# Patient Record
Sex: Female | Born: 1960 | Race: Black or African American | Hispanic: No | Marital: Married | Smoking: Never smoker
Health system: Southern US, Community
[De-identification: ages and names within clinical notes are randomized; demographics above are authoritative.]

## PROBLEM LIST (undated history)

## (undated) DIAGNOSIS — K219 Gastro-esophageal reflux disease without esophagitis: Secondary | ICD-10-CM

## (undated) DIAGNOSIS — E785 Hyperlipidemia, unspecified: Secondary | ICD-10-CM

## (undated) DIAGNOSIS — I10 Essential (primary) hypertension: Secondary | ICD-10-CM

## (undated) DIAGNOSIS — K449 Diaphragmatic hernia without obstruction or gangrene: Secondary | ICD-10-CM

## (undated) DIAGNOSIS — I2510 Atherosclerotic heart disease of native coronary artery without angina pectoris: Secondary | ICD-10-CM

## (undated) HISTORY — DX: Atherosclerotic heart disease of native coronary artery without angina pectoris: I25.10

## (undated) HISTORY — DX: Hyperlipidemia, unspecified: E78.5

## (undated) HISTORY — DX: Diaphragmatic hernia without obstruction or gangrene: K44.9

## (undated) HISTORY — PX: OTHER SURGICAL HISTORY: SHX169

## (undated) HISTORY — DX: Gastro-esophageal reflux disease without esophagitis: K21.9

## (undated) HISTORY — DX: Essential (primary) hypertension: I10

---

## 2011-04-12 DIAGNOSIS — K449 Diaphragmatic hernia without obstruction or gangrene: Secondary | ICD-10-CM | POA: Insufficient documentation

## 2012-10-21 DIAGNOSIS — R32 Unspecified urinary incontinence: Secondary | ICD-10-CM | POA: Insufficient documentation

## 2012-10-21 DIAGNOSIS — N819 Female genital prolapse, unspecified: Secondary | ICD-10-CM | POA: Insufficient documentation

## 2015-03-03 ENCOUNTER — Ambulatory Visit (INDEPENDENT_AMBULATORY_CARE_PROVIDER_SITE_OTHER): Payer: BLUE CROSS/BLUE SHIELD

## 2015-03-03 ENCOUNTER — Ambulatory Visit (INDEPENDENT_AMBULATORY_CARE_PROVIDER_SITE_OTHER): Payer: BLUE CROSS/BLUE SHIELD | Admitting: Physician Assistant

## 2015-03-03 ENCOUNTER — Encounter: Payer: Self-pay | Admitting: Physician Assistant

## 2015-03-03 VITALS — BP 130/76 | HR 75 | Ht 63.0 in | Wt 206.0 lb

## 2015-03-03 DIAGNOSIS — J989 Respiratory disorder, unspecified: Secondary | ICD-10-CM

## 2015-03-03 DIAGNOSIS — E785 Hyperlipidemia, unspecified: Secondary | ICD-10-CM | POA: Insufficient documentation

## 2015-03-03 DIAGNOSIS — R0683 Snoring: Secondary | ICD-10-CM

## 2015-03-03 DIAGNOSIS — E669 Obesity, unspecified: Secondary | ICD-10-CM

## 2015-03-03 DIAGNOSIS — M50322 Other cervical disc degeneration at C5-C6 level: Secondary | ICD-10-CM | POA: Diagnosis not present

## 2015-03-03 DIAGNOSIS — R0989 Other specified symptoms and signs involving the circulatory and respiratory systems: Secondary | ICD-10-CM

## 2015-03-03 DIAGNOSIS — E559 Vitamin D deficiency, unspecified: Secondary | ICD-10-CM

## 2015-03-03 DIAGNOSIS — M5412 Radiculopathy, cervical region: Secondary | ICD-10-CM

## 2015-03-03 DIAGNOSIS — R000 Tachycardia, unspecified: Secondary | ICD-10-CM | POA: Diagnosis not present

## 2015-03-03 DIAGNOSIS — M5030 Other cervical disc degeneration, unspecified cervical region: Secondary | ICD-10-CM | POA: Insufficient documentation

## 2015-03-03 DIAGNOSIS — R5383 Other fatigue: Secondary | ICD-10-CM

## 2015-03-03 DIAGNOSIS — R062 Wheezing: Secondary | ICD-10-CM

## 2015-03-03 DIAGNOSIS — Z131 Encounter for screening for diabetes mellitus: Secondary | ICD-10-CM

## 2015-03-03 DIAGNOSIS — N3281 Overactive bladder: Secondary | ICD-10-CM | POA: Insufficient documentation

## 2015-03-03 LAB — COMPLETE METABOLIC PANEL WITH GFR
ALT: 12 U/L (ref 6–29)
AST: 20 U/L (ref 10–35)
Albumin: 3.9 g/dL (ref 3.6–5.1)
Alkaline Phosphatase: 65 U/L (ref 33–130)
BUN: 15 mg/dL (ref 7–25)
CALCIUM: 9.1 mg/dL (ref 8.6–10.4)
CHLORIDE: 103 mmol/L (ref 98–110)
CO2: 30 mmol/L (ref 20–31)
CREATININE: 0.81 mg/dL (ref 0.50–1.05)
GFR, Est African American: >89 mL/min (ref >=60)
GFR, Est Non African American: 83 mL/min (ref >=60)
Glucose, Bld: 88 mg/dL (ref 65–99)
POTASSIUM: 4 mmol/L (ref 3.5–5.3)
SODIUM: 138 mmol/L (ref 135–146)
Total Bilirubin: 0.4 mg/dL (ref 0.2–1.2)
Total Protein: 6.8 g/dL (ref 6.1–8.1)

## 2015-03-03 LAB — LIPID PANEL
CHOL/HDL RATIO: 5.5 ratio — AB (ref <=5.0)
CHOLESTEROL: 246 mg/dL — AB (ref 125–200)
HDL: 45 mg/dL — ABNORMAL LOW (ref >=46)
LDL CALC: 179 mg/dL — AB (ref <130)
Triglycerides: 112 mg/dL (ref <150)
VLDL: 22 mg/dL (ref <30)

## 2015-03-03 MED ORDER — ALBUTEROL SULFATE HFA 108 (90 BASE) MCG/ACT IN AERS: 2.0000 | INHALATION_SPRAY | Freq: Four times a day (QID) | RESPIRATORY_TRACT | Status: DC | PRN

## 2015-03-03 MED ORDER — MELOXICAM 15 MG PO TABS: 15.0000 mg | ORAL_TABLET | Freq: Every day | ORAL | Status: DC

## 2015-03-03 MED ORDER — IPRATROPIUM-ALBUTEROL 0.5-2.5 (3) MG/3ML IN SOLN
3.0000 mL | Freq: Once | RESPIRATORY_TRACT | Status: AC
  Administered 2015-03-03: 3 mL via RESPIRATORY_TRACT

## 2015-03-03 NOTE — Patient Instructions (Addendum)
Will get formal PT.   Cervical Radiculopathy Cervical radiculopathy happens when a nerve in the neck (cervical nerve) is pinched or bruised. This condition can develop because of an injury or as part of the normal aging process. Pressure on the cervical nerves can cause pain or numbness that runs from the neck all the way down into the arm and fingers. Usually, this condition gets better with rest. Treatment may be needed if the condition does not improve.  CAUSES This condition may be caused by:  Injury.  Slipped (herniated) disk.  Muscle tightness in the neck because of overuse.  Arthritis.  Breakdown or degeneration in the bones and joints of the spine (spondylosis) due to aging.  Bone spurs that may develop near the cervical nerves. SYMPTOMS Symptoms of this condition include:  Pain that runs from the neck to the arm and hand. The pain can be severe or irritating. It may be worse when the neck is moved.  Numbness or weakness in the affected arm and hand. DIAGNOSIS This condition may be diagnosed based on symptoms, medical history, and a physical exam. You may also have tests, including:  X-rays.  CT scan.  MRI.  Electromyogram (EMG).  Nerve conduction tests. TREATMENT In many cases, treatment is not needed for this condition. With rest, the condition usually gets better over time. If treatment is needed, options may include:  Wearing a soft neck collar for short periods of time.  Physical therapy to strengthen your neck muscles.  Medicines, such as NSAIDs, oral corticosteroids, or spinal injections.  Surgery. This may be needed if other treatments do not help. Various types of surgery may be done depending on the cause of your problems. HOME CARE INSTRUCTIONS Managing Pain  Take over-the-counter and prescription medicines only as told by your health care provider.  If directed, apply ice to the affected area.  Put ice in a plastic bag.  Place a towel between  your skin and the bag.  Leave the ice on for 20 minutes, 2-3 times per day.  If ice does not help, you can try using heat. Take a warm shower or warm bath, or use a heat pack as told by your health care provider.  Try a gentle neck and shoulder massage to help relieve symptoms. Activity  Rest as needed. Follow instructions from your health care provider about any restrictions on activities.  Do stretching and strengthening exercises as told by your health care provider or physical therapist. General Instructions  If you were given a soft collar, wear it as told by your health care provider.  Use a flat pillow when you sleep.  Keep all follow-up visits as told by your health care provider. This is important. SEEK MEDICAL CARE IF:  Your condition does not improve with treatment. SEEK IMMEDIATE MEDICAL CARE IF:  Your pain gets much worse and cannot be controlled with medicines.  You have weakness or numbness in your hand, arm, face, or leg.  You have a high fever.  You have a stiff, rigid neck.  You lose control of your bowels or your bladder (have incontinence).  You have trouble with walking, balance, or speaking.   This information is not intended to replace advice given to you by your health care provider. Make sure you discuss any questions you have with your health care provider.   Document Released: 11/20/2000 Document Revised: 11/16/2014 Document Reviewed: 04/21/2014 Elsevier Interactive Patient Education [employer].

## 2015-03-03 NOTE — Progress Notes (Signed)
Subjective:    Patient ID: Olivia Miller, female    DOB: [birth year], 54 y.o.   MRN: [id number]  HPI  Pt is a 54 yo female who presents to the clinic to establish care.   .. Active Ambulatory Problems    Diagnosis Date Noted  . Hyperlipidemia 03/03/2015  . Tachycardia 03/03/2015  . Left cervical radiculopathy 03/03/2015  . OAB (overactive bladder) 03/03/2015  . DDD (degenerative disc disease), cervical 03/03/2015   Resolved Ambulatory Problems    Diagnosis Date Noted  . No Resolved Ambulatory Problems   No Additional Past Medical History   .[name] Family History  Problem Relation Age of Onset  . Cancer Father   . Stroke Father    .[name] Social History   Social History  . Marital Status: Married    Spouse Name: N/A  . Number of Children: N/A  . Years of Education: N/A   Occupational History  . Not on file.   Social History Main Topics  . Smoking status: Never Smoker   . Smokeless tobacco: Not on file  . Alcohol Use: No  . Drug Use: No  . Sexual Activity: Yes   Other Topics Concern  . Not on file   Social History Narrative  . No narrative on file   patient has multiple concerns today.  When her main concerns is tachycardia. She recently had an episode of increased heart rate that she went to the emergency room for full cardiac workup was done and was negative. They suggested if these episodes continue to get a stress test. She is not having any chest pain but continues to have some episodes of increased heart rate. She would like a stress test.  She is also having increased problem with her neck and into her left arm and fingers. It is not all the time but episodic. She's had this in the past. It did seem to be getting better but has returned. Numbness and tingling goes into all fingers but mainly her index middle and ring. Her left arm will feel very achy. Sometimes it aches in the upper arm even the touch. She is seen a rheumatologist and told she has some cervical  degenerative changes and also diagnosed her with fibromyalgia. 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 Her left normal sometimes even give out. She has not tried anything to make better.  Patient is also very tired. She has a history of vitamin D deficiency. She is taking vitamin D daily. She is not sleeping well. She admits to snoring. She wakes up numerous times during the night.  She has had some recent shortness of breath and wheezing. She denies any sinus pressure, fever, ear pain, sore throat. Cough is nonproductive. She has not taken anything to make better. She has never been diagnosed with asthma.  Hyperlipidemia. She has not had this rechecked in a while.     Xray normal. Online.fibro. Rheumatologist ruled out everything. Lost insurance. Throbbing pain. Left arm give out couldn't lift children. Starting exercise. Not using pain went aw   Review of Systems  All other systems reviewed and are negative.      Objective:   Physical Exam  Constitutional: She is oriented to person, place, and time. She appears well-developed and well-nourished.  Obese.   HENT:  Head: Normocephalic and atraumatic.  Right Ear: External ear normal.  Left Ear: External ear normal.  Nose: Nose normal.  Mouth/Throat: Oropharynx is clear and  moist.  TM's clear.  Negative for maxillary or frontal sinus pressure.   Eyes: Conjunctivae are normal. Right eye exhibits no discharge. Left eye exhibits no discharge.  Neck: Normal range of motion. Neck supple.  Cardiovascular: Normal rate, regular rhythm and normal heart sounds.   Pulmonary/Chest: Effort normal.  Bilateral expiratory wheezing heard on exam.   Musculoskeletal:  Normal ROM of both shoulders.  Strength 5/5 of upper extremities.  Hand grip 5/5.  No pain to palpation over left elbow.  Negative finklestein, phalens, tinels.  Tight neck muscles.  No pain to palpation over c-spine.   Lymphadenopathy:    She has  no cervical adenopathy.  Neurological: She is alert and oriented to person, place, and time.  Psychiatric: She has a normal mood and affect. Her behavior is normal.          Assessment & Plan:  Tachycardia- reassured pt that looked great today. On pt's request will order stress test.   Reactive airway disease- peak flows yellow. duoneb given in office today with improvement. Inhaler to be given as needed. Declined steriods. Follow up as needed or if worsening.   Left cervical radiculopathy- will get xray today of cspine. Discussed prednisone taper. Pt declined. mobic daily as needed. Discussed GI irritation. No hx of PUD. Take with food. Exercises given. Will get formal PT to start. Certainly if pt has fibromyalgia could be making this pain worse but not classic for only fibromyalgia pain I do think a musculoskeletal problem is present.   OAB-discussed kegals there are medication options as well pt will follow up with this.   Fatigue/obesity/snoring- STOP BANG 4 positive and high risk. Will order sleep study. Hx of vitamin D def. Will get vitamin D.

## 2015-03-04 LAB — VITAMIN D 25 HYDROXY (VIT D DEFICIENCY, FRACTURES): VIT D 25 HYDROXY: 33 ng/mL (ref 30–100)

## 2015-03-15 ENCOUNTER — Other Ambulatory Visit: Payer: Self-pay | Admitting: Physician Assistant

## 2015-03-15 MED ORDER — CYCLOBENZAPRINE HCL 10 MG PO TABS: 10.0000 mg | ORAL_TABLET | Freq: Every day | ORAL | Status: DC

## 2015-03-17 ENCOUNTER — Ambulatory Visit: Payer: BLUE CROSS/BLUE SHIELD | Admitting: Rehabilitative and Restorative Service Providers"

## 2015-05-04 ENCOUNTER — Ambulatory Visit (HOSPITAL_BASED_OUTPATIENT_CLINIC_OR_DEPARTMENT_OTHER): Payer: BLUE CROSS/BLUE SHIELD

## 2015-05-05 ENCOUNTER — Other Ambulatory Visit: Payer: Self-pay | Admitting: Physician Assistant

## 2015-06-02 ENCOUNTER — Encounter: Payer: Self-pay | Admitting: Physician Assistant

## 2015-06-02 ENCOUNTER — Ambulatory Visit (INDEPENDENT_AMBULATORY_CARE_PROVIDER_SITE_OTHER): Payer: BLUE CROSS/BLUE SHIELD | Admitting: Physician Assistant

## 2015-06-02 ENCOUNTER — Ambulatory Visit (INDEPENDENT_AMBULATORY_CARE_PROVIDER_SITE_OTHER): Payer: Self-pay

## 2015-06-02 DIAGNOSIS — M542 Cervicalgia: Secondary | ICD-10-CM

## 2015-06-02 DIAGNOSIS — M546 Pain in thoracic spine: Secondary | ICD-10-CM

## 2015-06-02 DIAGNOSIS — M25521 Pain in right elbow: Secondary | ICD-10-CM

## 2015-06-02 DIAGNOSIS — M25562 Pain in left knee: Secondary | ICD-10-CM

## 2015-06-02 DIAGNOSIS — M545 Low back pain: Secondary | ICD-10-CM

## 2015-06-02 DIAGNOSIS — M25561 Pain in right knee: Secondary | ICD-10-CM

## 2015-06-02 DIAGNOSIS — M549 Dorsalgia, unspecified: Secondary | ICD-10-CM
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
## 2015-06-02 NOTE — Patient Instructions (Signed)
Cervical Sprain  A cervical sprain is an injury in the neck in which the strong, fibrous tissues (ligaments) that connect your neck bones stretch or tear. Cervical sprains can range from mild to severe. Severe cervical sprains can cause the neck vertebrae to be unstable. This can lead to damage of the spinal cord and can result in serious nervous system problems. The amount of time it takes for a cervical sprain to get better depends on the cause and extent of the injury. Most cervical sprains heal in 1 to 3 weeks.  CAUSES   Severe cervical sprains may be caused by:    Contact sport injuries (such as from football, rugby, wrestling, hockey, auto racing, gymnastics, diving, martial arts, or boxing).    Motor vehicle collisions.    Whiplash injuries. This is an injury from a sudden forward and backward whipping movement of the head and neck.   Falls.   Mild cervical sprains may be caused by:    Being in an awkward position, such as while cradling a telephone between your ear and shoulder.    Sitting in a chair that does not offer proper support.    Working at a poorly designed computer station.    Looking up or down for long periods of time.   SYMPTOMS    Pain, soreness, stiffness, or a burning sensation in the front, back, or sides of the neck. This discomfort may develop immediately after the injury or slowly, 24 hours or more after the injury.    Pain or tenderness directly in the middle of the back of the neck.    Shoulder or upper back pain.    Limited ability to move the neck.    Headache.    Dizziness.    Weakness, numbness, or tingling in the hands or arms.    Muscle spasms.    Difficulty swallowing or chewing.    Tenderness and swelling of the neck.   DIAGNOSIS   Most of the time your health care provider can diagnose a cervical sprain by taking your history and doing a physical exam. Your health care provider will ask about previous neck injuries and any known neck  problems, such as arthritis in the neck. X-rays may be taken to find out if there are any other problems, such as with the bones of the neck. Other tests, such as a CT scan or MRI, may also be needed.   TREATMENT   Treatment depends on the severity of the cervical sprain. Mild sprains can be treated with rest, keeping the neck in place (immobilization), and pain medicines. Severe cervical sprains are immediately immobilized. Further treatment is done to help with pain, muscle spasms, and other symptoms and may include:   Medicines, such as pain relievers, numbing medicines, or muscle relaxants.    Physical therapy. This may involve stretching exercises, strengthening exercises, and posture training. Exercises and improved posture can help stabilize the neck, strengthen muscles, and help stop symptoms from returning.   HOME CARE INSTRUCTIONS    Put ice on the injured area.     Put ice in a plastic bag.     Place a towel between your skin and the bag.     Leave the ice on for 15-20 minutes, 3-4 times a day.    If your injury was severe, you may have been given a cervical collar to wear. A cervical collar is a two-piece collar designed to keep your neck from moving while it heals.      Do not remove the collar unless instructed by your health care provider.    If you have long hair, keep it outside of the collar.    Ask your health care provider before making any adjustments to your collar. Minor adjustments may be required over time to improve comfort and reduce pressure on your chin or on the back of your head.    Ifyou are allowed to remove the collar for cleaning or bathing, follow your health care provider's instructions on how to do so safely.    Keep your collar clean by wiping it with mild soap and water and drying it completely. If the collar you have been given includes removable pads, remove them every 1-2 days and hand wash them with soap and water. Allow them to air dry. They should be completely  dry before you wear them in the collar.    If you are allowed to remove the collar for cleaning and bathing, wash and dry the skin of your neck. Check your skin for irritation or sores. If you see any, tell your health care provider.    Do not drive while wearing the collar.    Only take over-the-counter or prescription medicines for pain, discomfort, or fever as directed by your health care provider.    Keep all follow-up appointments as directed by your health care provider.    Keep all physical therapy appointments as directed by your health care provider.    Make any needed adjustments to your workstation to promote good posture.    Avoid positions and activities that make your symptoms worse.    Warm up and stretch before being active to help prevent problems.   SEEK MEDICAL CARE IF:    Your pain is not controlled with medicine.    You are unable to decrease your pain medicine over time as planned.    Your activity level is not improving as expected.   SEEK IMMEDIATE MEDICAL CARE IF:    You develop any bleeding.   You develop stomach upset.   You have signs of an allergic reaction to your medicine.    Your symptoms get worse.    You develop new, unexplained symptoms.    You have numbness, tingling, weakness, or paralysis in any part of your body.   MAKE SURE YOU:    Understand these instructions.   Will watch your condition.   Will get help right away if you are not doing well or get worse.     This information is not intended to replace advice given to you by your health care provider. Make sure you discuss any questions you have with your health care provider.     Document Released: 12/23/2006 Document Revised: 03/02/2013 Document Reviewed: 09/02/2012  Elsevier Interactive Patient Education 2016 Else[employer]

## 2015-06-07 ENCOUNTER — Encounter: Payer: Self-pay | Admitting: Physician Assistant

## 2015-06-07 ENCOUNTER — Other Ambulatory Visit: Payer: Self-pay | Admitting: Physician Assistant

## 2015-06-07 DIAGNOSIS — I6521 Occlusion and stenosis of right carotid artery: Secondary | ICD-10-CM | POA: Insufficient documentation

## 2015-06-16 ENCOUNTER — Ambulatory Visit (HOSPITAL_BASED_OUTPATIENT_CLINIC_OR_DEPARTMENT_OTHER)
Admission: RE | Admit: 2015-06-16 | Discharge: 2015-06-16 | Disposition: A | Discharge status: Home / Self Care | Payer: Self-pay | Source: Ambulatory Visit | Attending: Physician Assistant | Admitting: Physician Assistant

## 2015-06-16 DIAGNOSIS — I6521 Occlusion and stenosis of right carotid artery: Secondary | ICD-10-CM | POA: Insufficient documentation

## 2015-06-20 ENCOUNTER — Other Ambulatory Visit: Payer: Self-pay | Admitting: Physician Assistant

## 2015-06-20 MED ORDER — ATORVASTATIN CALCIUM 40 MG PO TABS: 40.0000 mg | ORAL_TABLET | Freq: Every day | ORAL | Status: DC

## 2016-04-02 ENCOUNTER — Encounter: Payer: Self-pay | Admitting: Physician Assistant

## 2016-04-02 ENCOUNTER — Ambulatory Visit (INDEPENDENT_AMBULATORY_CARE_PROVIDER_SITE_OTHER): Payer: Managed Care, Other (non HMO) | Admitting: Physician Assistant

## 2016-04-02 VITALS — BP 165/100 | HR 82 | Ht 63.0 in | Wt 203.0 lb

## 2016-04-02 DIAGNOSIS — R0681 Apnea, not elsewhere classified: Secondary | ICD-10-CM | POA: Insufficient documentation

## 2016-04-02 DIAGNOSIS — J0140 Acute pansinusitis, unspecified: Secondary | ICD-10-CM

## 2016-04-02 DIAGNOSIS — G478 Other sleep disorders: Secondary | ICD-10-CM | POA: Insufficient documentation

## 2016-04-02 DIAGNOSIS — K0889 Other specified disorders of teeth and supporting structures: Secondary | ICD-10-CM | POA: Diagnosis not present

## 2016-04-02 DIAGNOSIS — R0683 Snoring: Secondary | ICD-10-CM | POA: Diagnosis not present

## 2016-04-02 DIAGNOSIS — Z1231 Encounter for screening mammogram for malignant neoplasm of breast: Secondary | ICD-10-CM

## 2016-04-02 DIAGNOSIS — Z800 Family history of malignant neoplasm of digestive organs: Secondary | ICD-10-CM

## 2016-04-02 DIAGNOSIS — Z1211 Encounter for screening for malignant neoplasm of colon: Secondary | ICD-10-CM

## 2016-04-02 NOTE — Progress Notes (Signed)
   Subjective:    Patient ID: Olivia Miller, female    DOB: Oct 04, 1960, 56 y.o.   MRN: [id number]  HPI  Pt is a 56 yo female who presents to the clinic after urgent care visit for sinusitis on 03/30/16. Pt had 3 wisdom teeth removed on 03/22/16 and 3 days later devleoped dry socket. She saw dentist and thought pain was from dry socket but she was then diagnosed with sinusitis. She is on augmentin, norco, toradol, and prednisone. She is feeling better today.   She request sleep apnea study but does not want ordered today.    Review of Systems  All other systems reviewed and are negative.      Objective:   Physical Exam  Constitutional: She is oriented to person, place, and time. She appears well-developed and well-nourished.  HENT:  Head: Normocephalic and atraumatic.  Right Ear: External ear normal.  Left Ear: External ear normal.  Nose: Nose normal.  Mouth/Throat: Oropharynx is clear and moist. No oropharyngeal exudate.  Right TM dull.  Left TM normal.  Tenderness over left maxillary sinuses and bilateral frontal sinuses.  Well healed wisdom tooth extraction sites.   Eyes: Conjunctivae are normal. Right eye exhibits no discharge. Left eye exhibits no discharge.  Neck: Normal range of motion. Neck supple.  Cardiovascular: Normal rate, regular rhythm and normal heart sounds.   Pulmonary/Chest: Effort normal and breath sounds normal. She has no wheezes.  Lymphadenopathy:    She has cervical adenopathy.  Neurological: She is alert and oriented to person, place, and time.  Psychiatric: She has a normal mood and affect. Her behavior is normal.          Assessment & Plan:  Marland KitchenMarland KitchenDiagnoses and all orders for this visit:  Acute non-recurrent pansinusitis  Visit for screening mammogram -     MM DIGITAL SCREENING BILATERAL; Future  Pain, dental  Snoring  Apneic episode  Non-restorative sleep  Colon cancer screening -     Ambulatory referral to Gastroenterology  Family  history of colon cancer -     Ambulatory referral to Gastroenterology   Continue on current sinusitis plan. Follow up as needed. Discussed to start tapering off norco. Only to use sparing. Abuse potential is high with these medications.    High risk stop bang. Will refer for sleep study when she request.   Needs CPE to discuss preventive care.

## 2016-04-10 ENCOUNTER — Encounter: Payer: Self-pay | Admitting: Physician Assistant

## 2016-04-10 ENCOUNTER — Ambulatory Visit (INDEPENDENT_AMBULATORY_CARE_PROVIDER_SITE_OTHER): Payer: Managed Care, Other (non HMO)

## 2016-04-10 ENCOUNTER — Ambulatory Visit (INDEPENDENT_AMBULATORY_CARE_PROVIDER_SITE_OTHER): Payer: Managed Care, Other (non HMO) | Admitting: Physician Assistant

## 2016-04-10 ENCOUNTER — Ambulatory Visit: Payer: Managed Care, Other (non HMO)

## 2016-04-10 VITALS — BP 130/89 | HR 86 | Ht 63.0 in | Wt 193.0 lb

## 2016-04-10 DIAGNOSIS — R197 Diarrhea, unspecified: Secondary | ICD-10-CM

## 2016-04-10 DIAGNOSIS — Z79899 Other long term (current) drug therapy: Secondary | ICD-10-CM

## 2016-04-10 DIAGNOSIS — R51 Headache: Secondary | ICD-10-CM | POA: Diagnosis not present

## 2016-04-10 DIAGNOSIS — J3489 Other specified disorders of nose and nasal sinuses: Secondary | ICD-10-CM

## 2016-04-10 DIAGNOSIS — Z1231 Encounter for screening mammogram for malignant neoplasm of breast: Secondary | ICD-10-CM

## 2016-04-10 DIAGNOSIS — R928 Other abnormal and inconclusive findings on diagnostic imaging of breast: Secondary | ICD-10-CM | POA: Diagnosis not present

## 2016-04-10 DIAGNOSIS — R519 Headache, unspecified: Secondary | ICD-10-CM

## 2016-04-10 DIAGNOSIS — Z13220 Encounter for screening for lipoid disorders: Secondary | ICD-10-CM

## 2016-04-10 DIAGNOSIS — E559 Vitamin D deficiency, unspecified: Secondary | ICD-10-CM

## 2016-04-10 NOTE — Progress Notes (Signed)
   Subjective:    Patient ID: Olivia Miller, female    DOB: 01-17-1961, 56 y.o.   MRN: [id number]  HPI  Pt comes to clinic to follow up after ED visit on 04/06/16 for worsening sinus pressure and dental pain. Pt was concerned because she started tapering off pain medication like instructed at our visit and her pain came back worse. She then felt very agitated and shaky and literally "ached all over".  In ED checked CBC, UA, lipase, EKG, CXR, Flu, CMP all was normal. She has continued to have diarrhea watery to yellow at least 6 times a day. immodium did help decreasing to 3-4 times a day but then came back. No fever or abdominal pain. Has not been on her probiotic. She has stopped all abx. She only took 7 days of abx. She continues to have left sided facial pressure and pain. She feels like her "head is full".  She would like to know if her sinus infection has cleared.    Review of Systems See HPI>     Objective:   Physical Exam  Constitutional: She is oriented to person, place, and time. She appears well-developed and well-nourished.  HENT:  Head: Normocephalic and atraumatic.  Right Ear: External ear normal.  Left Ear: External ear normal.  Nose: Nose normal.  Mouth/Throat: Oropharynx is clear and moist. No oropharyngeal exudate.  Tenderness over left maxillary sinusitis.   Eyes: Conjunctivae are normal.  Neck: Normal range of motion. Neck supple.  Cardiovascular: Normal rate, regular rhythm and normal heart sounds.   Pulmonary/Chest: Effort normal and breath sounds normal. She has no wheezes.  Lymphadenopathy:    She has no cervical adenopathy.  Neurological: She is alert and oriented to person, place, and time.  Psychiatric: She has a normal mood and affect. Her behavior is normal.          Assessment & Plan:  Marland KitchenMarland KitchenDiagnoses and all orders for this visit:  Diarrhea, unspecified type -     Clostridium difficile culture-fecal  Sinus pressure -     CT MAXILLOFACIAL WO  CONTRAST; Future -     CT MAXILLOFACIAL WO CONTRAST  Left facial pressure and pain -     CT MAXILLOFACIAL WO CONTRAST; Future -     CT MAXILLOFACIAL WO CONTRAST  Screening for lipid disorders -     Lipid panel  Medication management -     VITAMIN D 25 Hydroxy (Vit-D Deficiency, Fractures)  Vitamin D deficiency -     VITAMIN D 25 Hydroxy (Vit-D Deficiency, Fractures)  Visit for screening mammogram -     MM DIGITAL SCREENING BILATERAL   Will get CT to evaluate sinuses to see if infection is present.  Will make referral to ENT.   Due to diarrhea discussed could be normal variant due to abx; however, will rule out C.diff. Culture ordered. Instructed patient to restart probiotic.   I do think her worsening pain, shakiness, body aches could have been prednisone and even coming off pain medication. That has resolved for the most part.   Labs ordered for upcoming CPE.

## 2016-04-10 NOTE — Patient Instructions (Signed)
Will make referral to ENT.  

## 2016-04-11 ENCOUNTER — Ambulatory Visit (INDEPENDENT_AMBULATORY_CARE_PROVIDER_SITE_OTHER): Payer: Managed Care, Other (non HMO)

## 2016-04-11 DIAGNOSIS — R6884 Jaw pain: Secondary | ICD-10-CM | POA: Diagnosis not present

## 2016-04-11 DIAGNOSIS — G8918 Other acute postprocedural pain: Secondary | ICD-10-CM

## 2016-04-11 DIAGNOSIS — Z98818 Other dental procedure status: Secondary | ICD-10-CM | POA: Diagnosis not present

## 2016-04-11 LAB — VITAMIN D 25 HYDROXY (VIT D DEFICIENCY, FRACTURES): Vit D, 25-Hydroxy: 49 ng/mL (ref 30–100)

## 2016-04-11 LAB — LIPID PANEL
CHOL/HDL RATIO: 5.7 ratio — AB (ref <5.0)
Cholesterol: 233 mg/dL — ABNORMAL HIGH (ref <200)
HDL: 41 mg/dL — AB (ref >50)
LDL Cholesterol: 166 mg/dL — ABNORMAL HIGH (ref <100)
Triglycerides: 129 mg/dL (ref <150)
VLDL: 26 mg/dL (ref <30)

## 2016-04-12 ENCOUNTER — Telehealth: Payer: Self-pay | Admitting: *Deleted

## 2016-04-12 MED ORDER — METRONIDAZOLE 500 MG PO TABS: 500.0000 mg | ORAL_TABLET | Freq: Three times a day (TID) | ORAL | 0 refills | Status: DC

## 2016-04-12 NOTE — Telephone Encounter (Signed)
Patient is calling because she is still having diarrhea. She states the immodium is not helping at all. She is having diarrhea to the point where "it's falling out " of her. Please advise

## 2016-04-12 NOTE — Telephone Encounter (Signed)
Ok lets go ahead and treat for c.diff infection with pending results. Please send in metronidazole 500mg  three times a day for 10 days. #30 tablets. Will call when results come in. Do not drink alcohol while on metronidazole. If you can not stay properly hydrated then need to go to urgent care for fluids.

## 2016-04-12 NOTE — Progress Notes (Signed)
Call pt: vitamin D looks great.   Cholesterol is still elevated. We need to consider statin to lower cholesterol.

## 2016-04-12 NOTE — Progress Notes (Signed)
Call pt: CT showed clear sinuses. Sinusitis has cleared. Keep ENT appt.

## 2016-04-15 ENCOUNTER — Other Ambulatory Visit: Payer: Self-pay | Admitting: Physician Assistant

## 2016-04-15 MED ORDER — ATORVASTATIN CALCIUM 40 MG PO TABS: 40.0000 mg | ORAL_TABLET | Freq: Every day | ORAL | 1 refills | Status: DC

## 2016-04-15 NOTE — Progress Notes (Signed)
Sent statin do not start until feeling better. How is diarrhea today? C.diff is still pending.

## 2016-04-17 LAB — CLOSTRIDIUM DIFFICILE CULTURE-FECAL

## 2016-04-18 ENCOUNTER — Encounter: Payer: Self-pay | Admitting: Physician Assistant

## 2016-04-18 DIAGNOSIS — A0472 Enterocolitis due to Clostridium difficile, not specified as recurrent: Secondary | ICD-10-CM | POA: Insufficient documentation

## 2016-04-18 LAB — CLOSTRIDIUM DIFFICILE TOXIN: C DIFF TOXIN A: DETECTED — AB

## 2016-04-23 ENCOUNTER — Encounter: Payer: Self-pay | Admitting: Physician Assistant

## 2016-04-23 DIAGNOSIS — N6310 Unspecified lump in the right breast, unspecified quadrant: Secondary | ICD-10-CM | POA: Insufficient documentation

## 2016-04-23 NOTE — Progress Notes (Signed)
Call pt: mass seen of right breast. Follow up with more images.

## 2016-04-25 ENCOUNTER — Other Ambulatory Visit: Payer: Self-pay | Admitting: Physician Assistant

## 2016-04-25 DIAGNOSIS — R928 Other abnormal and inconclusive findings on diagnostic imaging of breast: Secondary | ICD-10-CM

## 2016-04-29 ENCOUNTER — Other Ambulatory Visit: Payer: Self-pay | Admitting: Physician Assistant

## 2016-04-29 ENCOUNTER — Telehealth: Payer: Self-pay

## 2016-04-29 DIAGNOSIS — Z0183 Encounter for blood typing: Secondary | ICD-10-CM

## 2016-04-29 DIAGNOSIS — R928 Other abnormal and inconclusive findings on diagnostic imaging of breast: Secondary | ICD-10-CM

## 2016-04-29 NOTE — Telephone Encounter (Signed)
Will fax downstairs to have drawn at her convince.

## 2016-04-29 NOTE — Telephone Encounter (Signed)
Unsure what to order. Routing for review.

## 2016-04-29 NOTE — Telephone Encounter (Signed)
Pt informed. Pt expressed understanding and is agreeable. Casie Sturgeon CMA, RT 

## 2016-04-29 NOTE — Telephone Encounter (Signed)
Pt called stating that she wants to know her blood type. Pt want's to know if she can come in to the lab to have it done?

## 2016-04-29 NOTE — Telephone Encounter (Signed)
Ok to order lab for blood type and fax downstairs.

## 2016-04-30 ENCOUNTER — Ambulatory Visit
Admission: RE | Admit: 2016-04-30 | Discharge: 2016-04-30 | Disposition: A | Discharge status: Home / Self Care | Payer: Managed Care, Other (non HMO) | Source: Ambulatory Visit | Attending: Physician Assistant | Admitting: Physician Assistant

## 2016-05-01 LAB — ABO AND RH: RH TYPE: POSITIVE

## 2016-05-01 NOTE — Progress Notes (Signed)
Call pt: O+

## 2016-08-02 ENCOUNTER — Ambulatory Visit (INDEPENDENT_AMBULATORY_CARE_PROVIDER_SITE_OTHER): Payer: Managed Care, Other (non HMO) | Admitting: Physician Assistant

## 2016-08-02 ENCOUNTER — Encounter: Payer: Self-pay | Admitting: Physician Assistant

## 2016-08-02 ENCOUNTER — Other Ambulatory Visit (HOSPITAL_COMMUNITY)
Admission: RE | Admit: 2016-08-02 | Discharge: 2016-08-02 | Disposition: A | Discharge status: Home / Self Care | Payer: Managed Care, Other (non HMO) | Source: Ambulatory Visit | Attending: Physician Assistant | Admitting: Physician Assistant

## 2016-08-02 VITALS — BP 126/80 | HR 85 | Ht 63.0 in | Wt 203.0 lb

## 2016-08-02 DIAGNOSIS — E7800 Pure hypercholesterolemia, unspecified: Secondary | ICD-10-CM | POA: Insufficient documentation

## 2016-08-02 DIAGNOSIS — N6310 Unspecified lump in the right breast, unspecified quadrant: Secondary | ICD-10-CM | POA: Diagnosis not present

## 2016-08-02 DIAGNOSIS — Z01419 Encounter for gynecological examination (general) (routine) without abnormal findings: Secondary | ICD-10-CM | POA: Diagnosis not present

## 2016-08-02 DIAGNOSIS — B353 Tinea pedis: Secondary | ICD-10-CM | POA: Insufficient documentation

## 2016-08-02 DIAGNOSIS — N841 Polyp of cervix uteri: Secondary | ICD-10-CM | POA: Diagnosis not present

## 2016-08-02 DIAGNOSIS — Z131 Encounter for screening for diabetes mellitus: Secondary | ICD-10-CM

## 2016-08-02 DIAGNOSIS — E669 Obesity, unspecified: Secondary | ICD-10-CM | POA: Diagnosis not present

## 2016-08-02 DIAGNOSIS — Z0000 Encounter for general adult medical examination without abnormal findings: Secondary | ICD-10-CM

## 2016-08-02 DIAGNOSIS — Z6835 Body mass index (BMI) 35.0-35.9, adult: Secondary | ICD-10-CM | POA: Insufficient documentation

## 2016-08-02 DIAGNOSIS — Z1159 Encounter for screening for other viral diseases: Secondary | ICD-10-CM

## 2016-08-02 DIAGNOSIS — Z113 Encounter for screening for infections with a predominantly sexual mode of transmission: Secondary | ICD-10-CM

## 2016-08-02 MED ORDER — FLUCONAZOLE 150 MG PO TABS: 150.0000 mg | ORAL_TABLET | Freq: Once | ORAL | 0 refills | Status: AC

## 2016-08-02 MED ORDER — NALTREXONE-BUPROPION HCL ER 8-90 MG PO TB12: ORAL_TABLET | ORAL | 0 refills | Status: DC

## 2016-08-02 MED ORDER — CLOTRIMAZOLE 1 % EX CREA: 1.0000 "application " | TOPICAL_CREAM | Freq: Two times a day (BID) | CUTANEOUS | 0 refills | Status: DC

## 2016-08-02 NOTE — Patient Instructions (Signed)
ASA 81mg .   Keeping You Healthy  Get These Tests  Blood Pressure- Have your blood pressure checked by your healthcare provider at least once a year.  Normal blood pressure is 120/80.  Weight- Have your body mass index (BMI) calculated to screen for obesity.  BMI is a measure of body fat based on height and weight.  You can calculate your own BMI at [employer]  Cholesterol- Have your cholesterol checked every year.  Diabetes- Have your blood sugar checked every year if you have high blood pressure, high cholesterol, a family history of diabetes or if you are overweight.  Pap Test - Have a pap test every 1 to 5 years if you have been sexually active.  If you are older than 65 and recent pap tests have been normal you may not need additional pap tests.  In addition, if you have had a hysterectomy  for benign disease additional pap tests are not necessary.  Mammogram-Yearly mammograms are essential for early detection of breast cancer  Screening for Colon Cancer- Colonoscopy starting at age 37. Screening may begin sooner depending on your family history and other health conditions.  Follow up colonoscopy as directed by your Gastroenterologist.  Screening for Osteoporosis- Screening begins at age 43 with bone density scanning, sooner if you are at higher risk for developing Osteoporosis.  Get these medicines  Calcium with Vitamin D- Your body requires 1200-1500 mg of Calcium a day and [phone] IU of Vitamin D a day.  You can only absorb 500 mg of Calcium at a time therefore Calcium must be taken in 2 or 3 separate doses throughout the day.  Hormones- Hormone therapy has been associated with increased risk for certain cancers and heart disease.  Talk to your healthcare provider about if you need relief from menopausal symptoms.  Aspirin- Ask your healthcare provider about taking Aspirin to prevent Heart Disease and Stroke.  Get these Immuniztions  Flu shot- Every  fall  Pneumonia shot- Once after the age of 52; if you are younger ask your healthcare provider if you need a pneumonia shot.  Tetanus- Every ten years.  Zostavax- Once after the age of 57 to prevent shingles.  Take these steps  Don't smoke- Your healthcare provider can help you quit. For tips on how to quit, ask your healthcare provider or go to www.smokefree.gov or call 1-800 QUIT-NOW.  Be physically active- Exercise 5 days a week for a minimum of 30 minutes.  If you are not already physically active, start slow and gradually work up to 30 minutes of moderate physical activity.  Try walking, dancing, bike riding, swimming, etc.  Eat a healthy diet- Eat a variety of healthy foods such as fruits, vegetables, whole grains, low fat milk, low fat cheeses, yogurt, lean meats, chicken, fish, eggs, dried beans, tofu, etc.  For more information go to www.thenutritionsource.org  Dental visit- Brush and floss teeth twice daily; visit your dentist twice a year.  Eye exam- Visit your Optometrist or Ophthalmologist yearly.  Drink alcohol in moderation- Limit alcohol intake to one drink or less a day.  Never drink and drive.  Depression- Your emotional health is as important as your physical health.  If you're feeling down or losing interest in things you normally enjoy, please talk to your healthcare provider.  Seat Belts- can save your life; always wear one  Smoke/Carbon Monoxide detectors- These detectors need to be installed on the appropriate level of your home.  Replace batteries at least once a  year.  Violence- If anyone is threatening or hurting you, please tell your healthcare provider.  Living Will/ Health care power of attorney- Discuss with your healthcare provider and family.

## 2016-08-02 NOTE — Progress Notes (Signed)
Subjective:     Olivia Miller is a 56 y.o. female and is here for a comprehensive physical exam. 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 it seems to come back yearly. .  Social History   Social History  . Marital status: Married    Spouse name: N/A  . Number of children: N/A  . Years of education: N/A   Occupational History  . Not on file.   Social History Main Topics  . Smoking status: Never Smoker  . Smokeless tobacco: Never Used  . Alcohol use No  . Drug use: No  . Sexual activity: Yes   Other Topics Concern  . Not on file   Social History Narrative  . No narrative on file   Health Maintenance  Topic Date Due  . Hepatitis C Screening  10-Dec-1960  . TETANUS/TDAP  05/20/1979  . PAP SMEAR  05/19/1981  . COLONOSCOPY  05/20/2010  . HIV Screening  04/02/2026 (Originally 05/20/1975)  . INFLUENZA VACCINE  04/02/2036 (Originally 10/09/2016)  . MAMMOGRAM  04/10/2018    The following portions of the patient's history were reviewed and updated as appropriate: allergies, current medications, past family history, past medical history, past social history, past surgical history and problem list.  Review of Systems Pertinent items noted in HPI and remainder of comprehensive ROS otherwise negative.   Objective:    BP 126/80   Pulse 85   Ht 5\' 3"  (1.6 m)   Wt 203 lb (92.1 kg)   BMI 35.96 kg/m  General appearance: alert, cooperative, appears stated age and mildly obese Head: Normocephalic, without obvious abnormality, atraumatic Eyes: conjunctivae/corneas clear. PERRL, EOM's intact. Fundi benign. Ears: normal TM's and external ear canals both ears Nose: Nares normal. Septum midline. Mucosa normal. No drainage or sinus tenderness. Throat: lips, mucosa, and tongue normal; teeth and gums normal Neck: no adenopathy, no carotid bruit, no JVD, supple, symmetrical, trachea midline and  thyroid not enlarged, symmetric, no tenderness/mass/nodules Back: symmetric, no curvature. ROM normal. No CVA tenderness. Lungs: clear to auscultation bilaterally Heart: regular rate and rhythm, S1, S2 normal, no murmur, click, rub or gallop Abdomen: soft, non-tender; bowel sounds normal; no masses,  no organomegaly Pelvic: external genitalia normal, no adnexal masses or tenderness, no cervical motion tenderness, uterus normal size, shape, and consistency, vagina normal without discharge and friable cervix polyp at around 11 oclock.  Extremities: extremities normal, atraumatic, no cyanosis or edema Pulses: 2+ and symmetric Skin: Skin color, texture, turgor normal. No rashes or lesions or left foot fine scales in between toes and along circumference of feet.  Lymph nodes: Cervical, supraclavicular, and axillary nodes normal. Neurologic: Alert and oriented X 3, normal strength and tone. Normal symmetric reflexes. Normal coordination and gait    Assessment:    Healthy female exam.      Plan:     Olivia Miller was seen today for gynecologic exam.  Diagnoses and all orders for this visit:  Routine physical examination  Pap smear, as part of routine gynecological examination -     Cytology - PAP  Pure hypercholesterolemia  Obesity (BMI 35.0-39.9 without comorbidity) -     Naltrexone-Bupropion HCl ER 8-90 MG TB12; Take 1 tablet in am for 1 week, then increase to 1 tablet in am and 1 tablet in pm for 1 week, then increase to 2 tablets in am and 1 tablet in pm for  1 week, then increase to 2 tablets in am and 2 tablets in pm daily.  Screening for diabetes mellitus -     COMPLETE METABOLIC PANEL WITH GFR  Need for hepatitis C screening test -     Hepatitis C Antibody  Screen for STD (sexually transmitted disease) -     HSV(herpes smplx)abs-1+2(IgG+IgM)-bld -     RPR -     HIV antibody (with reflex)  Breast mass, right  Tinea pedis of left foot -     clotrimazole (LOTRIMIN) 1 % cream;  Apply 1 application topically 2 (two) times daily. -     fluconazole (DIFLUCAN) 150 MG tablet; Take 1 tablet (150 mg total) by mouth once.  Cervical polyp -     Ambulatory referral to Gynecology     Colonoscopy-scheduled mammo-right breast mass follow up every 6 months Pt declined Tdap.HO given shingrix discussed. Pt declined.  Discussed medication for weight loss. Will try contrave. Discussed side effects. Follow up in 1 month.  Pt is not taking lipitor. She would like to try lifestyle changes and recheck lipid level in 3-6 months.  See After Visit Summary for Counseling Recommendations

## 2016-08-03 LAB — COMPLETE METABOLIC PANEL WITH GFR
ALT: 12 U/L (ref 6–29)
AST: 20 U/L (ref 10–35)
Albumin: 3.6 g/dL (ref 3.6–5.1)
Alkaline Phosphatase: 68 U/L (ref 33–130)
BUN: 12 mg/dL (ref 7–25)
CALCIUM: 8.8 mg/dL (ref 8.6–10.4)
CHLORIDE: 104 mmol/L (ref 98–110)
CO2: 25 mmol/L (ref 20–31)
CREATININE: 0.8 mg/dL (ref 0.50–1.05)
GFR, EST NON AFRICAN AMERICAN: 83 mL/min (ref >=60)
Glucose, Bld: 88 mg/dL (ref 65–99)
Potassium: 4.2 mmol/L (ref 3.5–5.3)
Sodium: 139 mmol/L (ref 135–146)
Total Bilirubin: 0.4 mg/dL (ref 0.2–1.2)
Total Protein: 6.4 g/dL (ref 6.1–8.1)

## 2016-08-03 LAB — HEPATITIS C ANTIBODY: HCV AB: NEGATIVE

## 2016-08-03 LAB — RPR

## 2016-08-03 LAB — HIV ANTIBODY (ROUTINE TESTING W REFLEX): HIV 1&2 Ab, 4th Generation: NONREACTIVE

## 2016-08-06 DIAGNOSIS — B353 Tinea pedis: Secondary | ICD-10-CM | POA: Insufficient documentation

## 2016-08-06 DIAGNOSIS — N841 Polyp of cervix uteri: Secondary | ICD-10-CM | POA: Insufficient documentation

## 2016-08-06 LAB — CYTOLOGY - PAP
Bacterial vaginitis: NEGATIVE
CANDIDA VAGINITIS: NEGATIVE
CHLAMYDIA, DNA PROBE: NEGATIVE
Diagnosis: NEGATIVE
HPV: NOT DETECTED
NEISSERIA GONORRHEA: NEGATIVE
TRICH (WINDOWPATH): NEGATIVE

## 2016-08-08 LAB — HM COLONOSCOPY

## 2016-08-30 ENCOUNTER — Ambulatory Visit: Payer: Managed Care, Other (non HMO) | Admitting: Physician Assistant

## 2016-09-01 ENCOUNTER — Encounter: Payer: Self-pay | Admitting: Physician Assistant

## 2016-09-02 ENCOUNTER — Telehealth: Payer: Self-pay | Admitting: Physician Assistant

## 2016-09-02 ENCOUNTER — Encounter: Payer: Self-pay | Admitting: Osteopathic Medicine

## 2016-09-02 ENCOUNTER — Ambulatory Visit (INDEPENDENT_AMBULATORY_CARE_PROVIDER_SITE_OTHER): Payer: 59

## 2016-09-02 ENCOUNTER — Ambulatory Visit (INDEPENDENT_AMBULATORY_CARE_PROVIDER_SITE_OTHER): Payer: 59 | Admitting: Osteopathic Medicine

## 2016-09-02 VITALS — BP 140/78 | HR 90 | Wt 203.0 lb

## 2016-09-02 DIAGNOSIS — M79672 Pain in left foot: Secondary | ICD-10-CM | POA: Diagnosis not present

## 2016-09-02 DIAGNOSIS — Z113 Encounter for screening for infections with a predominantly sexual mode of transmission: Secondary | ICD-10-CM

## 2016-09-02 DIAGNOSIS — J029 Acute pharyngitis, unspecified: Secondary | ICD-10-CM | POA: Diagnosis not present

## 2016-09-02 DIAGNOSIS — B353 Tinea pedis: Secondary | ICD-10-CM | POA: Diagnosis not present

## 2016-09-02 DIAGNOSIS — J4521 Mild intermittent asthma with (acute) exacerbation: Secondary | ICD-10-CM

## 2016-09-02 LAB — POCT RAPID STREP A (OFFICE): Rapid Strep A Screen: NEGATIVE

## 2016-09-02 MED ORDER — ALBUTEROL SULFATE HFA 108 (90 BASE) MCG/ACT IN AERS: 2.0000 | INHALATION_SPRAY | Freq: Four times a day (QID) | RESPIRATORY_TRACT | 11 refills | Status: DC | PRN

## 2016-09-02 MED ORDER — PREDNISONE 20 MG PO TABS: 20.0000 mg | ORAL_TABLET | Freq: Two times a day (BID) | ORAL | 0 refills | Status: DC

## 2016-09-02 MED ORDER — TERBINAFINE HCL 250 MG PO TABS: 250.0000 mg | ORAL_TABLET | Freq: Every day | ORAL | 0 refills | Status: DC

## 2016-09-02 NOTE — Progress Notes (Signed)
HPI: Olivia Miller is a 56 y.o. female  who presents to [facility] today, 09/02/16,  for chief complaint of:  Chief Complaint  Patient presents with  . Sore Throat  . Foot Pain    Seen 1 month ago by PCP for physical  Sore throat:started 4 days ago, lost voice for a bit but this got a bit better yesterday. Coughing/ wheezing and body aches. Has tried chloraseptic spray, Theraflu. She states she had some kind of lung test in the past which showed asthma, however she is not on any inhaled medications for this.  Athlete's foot issue mentioned at that time - PE noted "Skin: Skin color, texture, turgor normal. No rashes or lesions or left foot fine scales in between toes and along circumference of feet." Patient states the topical treatments are not helpful.  HLD: not taking Lipitor at last visit.  Prescribed Contrave for weight loss - states her pharmacy contacted [place] about process for prior authorization but has not heard anything back yet.    Past medical history, surgical history, social history and family history reviewed.  Patient Active Problem List   Diagnosis Date Noted  . Cervical polyp 08/06/2016  . Tinea pedis of left foot 08/06/2016  . Breast mass, right 04/23/2016  . Clostridium difficile colitis 04/18/2016  . Snoring 04/02/2016  . Apneic episode 04/02/2016  . Non-restorative sleep 04/02/2016  . Calcification of right carotid artery 06/07/2015  . Hyperlipidemia 03/03/2015  . Tachycardia 03/03/2015  . Left cervical radiculopathy 03/03/2015  . OAB (overactive bladder) 03/03/2015  . DDD (degenerative disc disease), cervical 03/03/2015    Current medication list and allergy/intolerance information reviewed.   Current Outpatient Prescriptions on File Prior to Visit  Medication Sig Dispense Refill  . clotrimazole (LOTRIMIN) 1 % cream Apply 1 application topically 2 (two) times daily. 60 g 0  . Naltrexone-Bupropion HCl ER 8-90 MG TB12  Take 1 tablet in am for 1 week, then increase to 1 tablet in am and 1 tablet in pm for 1 week, then increase to 2 tablets in am and 1 tablet in pm for 1 week, then increase to 2 tablets in am and 2 tablets in pm daily. 70 tablet 0   No current facility-administered medications on file prior to visit.    No Known Allergies    Review of Systems:  Constitutional: +recent illness  HEENT: +headache, no vision change  Cardiac: No  chest pain, No  pressure, No palpitations  Respiratory:  No  shortness of breath. +Cough  Gastrointestinal: No  abdominal pain, no change on bowel habits  Musculoskeletal: +new myalgia/arthralgia  Skin: +Rash  Neurologic: No  weakness, No  Dizziness   Exam:  BP 140/78   Pulse 90   Wt 203 lb (92.1 kg)   BMI 35.96 kg/m   Constitutional: VS see above. General Appearance: alert, well-developed, well-nourished, NAD  Eyes: Normal lids and conjunctive, non-icteric sclera  Ears, Nose, Mouth, Throat: MMM, Normal external inspection ears/nares/mouth/lips/gums.  Neck: No masses, trachea midline.   Respiratory: Normal respiratory effort. + mild bilateral wheeze, no rhonchi, no rales  Cardiovascular: S1/S2 normal, no murmur, no rub/gallop auscultated. RRR.   Musculoskeletal: Gait normal. Symmetric and independent movement of all extremities  Neurological: Normal balance/coordination. No tremor.  Skin: warm, dry, scaling of left foot on sole of foot, no discoloration/erythema, no ulceration or drainage.  Psychiatric: Normal judgment/insight. Normal mood and affect. Oriented x3.    Recent Results (from the past 2160 hour(s))  Cytology - PAP     Status: None   Collection Time: 08/02/16 12:00 AM  Result Value Ref Range   Adequacy      Satisfactory for evaluation  endocervical/transformation zone component PRESENT.   Diagnosis      NEGATIVE FOR INTRAEPITHELIAL LESIONS OR MALIGNANCY.   HPV NOT DETECTED     Comment: Normal Reference Range - NOT Detected    Chlamydia Negative     Comment: Normal Reference Range - Negative   Neisseria gonorrhea Negative     Comment: Normal Reference Range - Negative   Bacterial vaginitis Negative for Bacterial Vaginitis Microorganisms     Comment: Normal Reference Range - Negative   Candida vaginitis Negative for Candida species     Comment: Normal Reference Range - Negative   Trichomonas Negative     Comment: Normal Reference Range - Negative   Material Submitted CervicoVaginal Pap [ThinPrep Imaged]   RPR     Status: None   Collection Time: 08/02/16 10:21 AM  Result Value Ref Range   RPR Ser Ql NON REAC NON REAC  HIV antibody (with reflex)     Status: None   Collection Time: 08/02/16 10:21 AM  Result Value Ref Range   HIV 1&2 Ab, 4th Generation NONREACTIVE NONREACTIVE    Comment:   HIV-1 antigen and HIV-1/HIV-2 antibodies were not detected.  There is no laboratory evidence of HIV infection.   HIV-1/2 Antibody Diff        Not indicated. HIV-1 RNA, Qual TMA          Not indicated.     PLEASE NOTE: This information has been disclosed to you from records whose confidentiality may be protected by state law. If your state requires such protection, then the state law prohibits you from making any further disclosure of the information without the specific written consent of the person to whom it pertains, or as otherwise permitted by law. A general authorization for the release of medical or other information is NOT sufficient for this purpose.   The performance of this assay has not been clinically validated in patients less than 70 years old.   For additional information please refer to http://education.quest[employer].com/faq/FAQ106.  (This link is being provided for informational/educational purposes only.)     Hepatitis C Antibody     Status: None   Collection Time: 08/02/16 10:24 AM  Result Value Ref Range   HCV Ab NEGATIVE NEGATIVE  COMPLETE METABOLIC PANEL WITH GFR     Status: None    Collection Time: 08/02/16 10:27 AM  Result Value Ref Range   Sodium 139 135 - 146 mmol/L   Potassium 4.2 3.5 - 5.3 mmol/L   Chloride 104 98 - 110 mmol/L   CO2 25 20 - 31 mmol/L   Glucose, Bld 88 65 - 99 mg/dL   BUN 12 7 - 25 mg/dL   Creat 0.80 0.50 - 1.05 mg/dL    Comment:   For patients > or = 56 years of age: The upper reference limit for Creatinine is approximately 13% higher for people identified as African-American.      Total Bilirubin 0.4 0.2 - 1.2 mg/dL   Alkaline Phosphatase 68 33 - 130 U/L   AST 20 10 - 35 U/L   ALT 12 6 - 29 U/L   Total Protein 6.4 6.1 - 8.1 g/dL   Albumin 3.6 3.6 - 5.1 g/dL   Calcium 8.8 8.6 - 10.4 mg/dL   GFR, Est African American >89 >=60 mL/min  GFR, Est Non African American 83 >=60 mL/min  HM COLONOSCOPY     Status: None   Collection Time: 08/08/16 12:00 AM  Result Value Ref Range   HM Colonoscopy See Report (in chart) See Report (in chart), Patient Reported   Recent labs reviewed with the patient, no concerns.  Results for orders placed or performed in visit on 09/02/16 (from the past 24 hour(s))  POCT rapid strep A     Status: None   Collection Time: 09/02/16  8:38 AM  Result Value Ref Range   Rapid Strep A Screen Negative Negative    . ASSESSMENT/PLAN:   Tinea pedis of left foot - Discontinue topical therapy, escalate to oral. Patient advised get rid of any possibly contaminated shoes - Plan: terbinafine (LAMISIL) 250 MG tablet  Sore throat - Likely viral URI with associated mild asthma exacerbation, supportive care, steroids, inhaler. Patient to call back if no better/worse - Plan: POCT rapid strep A  Mild intermittent reactive airway disease with acute exacerbation - Review chart, no PFTs on record. We'll treat as asthma and would recommend outpatient follow-up with PCP. - Plan: predniSONE (DELTASONE) 20 MG tablet, albuterol (PROVENTIL HFA;VENTOLIN HFA) 108 (90 Base) MCG/ACT inhaler  Left foot pain - Not enough time to fully  address today, we'll get x-ray and patient to follow-up with sports medicine - Plan: DG Foot Complete Left  Screening for STDs (sexually transmitted diseases) - Patient had some questions about possible herpes exposure, never had an outbreak. No HSV serum test advised at this time - Plan: HSV(herpes simplex vrs) 1+2 ab-IgG, HSV(herpes simplex vrs) 1+2 ab-IgM    Patient Instructions  PLAN: Xray foot - consider follow up with sport medicine New medication for foot fungal infection Steroid burst and inhaler for cough - sore throat is likely viral  If no better in next few days, or if worse, call us!     Follow-up plan: Return in about 3 months (around 12/03/2016) for recheck with J[facility] - breathing, weight and cholesterol, sooner if needed for foot .  Visit summary with medication list and pertinent instructions was printed for patient to review, alert [place] if any changes needed. All questions at time of visit were answered - patient instructed to contact office with any additional concerns. ER/RTC precautions were reviewed with the patient and understanding verbalized.

## 2016-09-02 NOTE — Patient Instructions (Signed)
PLAN: Xray foot - consider follow up with sport medicine New medication for foot fungal infection Steroid burst and inhaler for cough - sore throat is likely viral  If no better in next few days, or if worse, call us!

## 2016-09-02 NOTE — Telephone Encounter (Signed)
Received fax for Prior Auth on contrave sent through cover my meds and received approval. -   Case ID: [id number] Good from 08/03/16 - 12/31/16  I will notify pharmacy. - CF

## 2016-09-04 ENCOUNTER — Telehealth: Payer: Self-pay | Admitting: Physician Assistant

## 2016-09-04 NOTE — Telephone Encounter (Signed)
Pt called. She has concerns about taking Contrave(has script) because she has had seizures as a child (although she is not currently diagnosed with the disorder). Thank you.

## 2016-09-06 NOTE — Telephone Encounter (Signed)
Call patient: There is a risk with seizure with this medication mostly because of the bupropion component. Typically seizure is related to the dose and the dose of bupropion in the stroke is actually very very low she would be very unusual for her to have a reaction. The most important thing is that you follow the instructions on the bottle and time not take more than it actually says. If you miss a dose do not double up.

## 2016-09-06 NOTE — Telephone Encounter (Signed)
Olivia Miller pt. Please advise.

## 2016-09-09 NOTE — Telephone Encounter (Signed)
Spoke with pt and verbalized understanding. No further questions or concerns at this time.

## 2016-09-29 ENCOUNTER — Other Ambulatory Visit: Payer: Self-pay | Admitting: Physician Assistant

## 2016-09-29 DIAGNOSIS — E669 Obesity, unspecified: Secondary | ICD-10-CM

## 2016-10-02 ENCOUNTER — Encounter: Payer: Managed Care, Other (non HMO) | Admitting: Obstetrics & Gynecology

## 2016-10-24 ENCOUNTER — Encounter: Payer: Self-pay | Admitting: Family Medicine

## 2016-10-24 ENCOUNTER — Ambulatory Visit (INDEPENDENT_AMBULATORY_CARE_PROVIDER_SITE_OTHER): Payer: 59 | Admitting: Family Medicine

## 2016-10-24 VITALS — BP 120/81 | HR 83 | Wt 204.0 lb

## 2016-10-24 DIAGNOSIS — H1012 Acute atopic conjunctivitis, left eye: Secondary | ICD-10-CM

## 2016-10-24 NOTE — Progress Notes (Signed)
       Olivia Miller is a 56 y.o. female who presents to [facility] today for conjunctivitis. Patient notes mild left eye redness. This is been present for one day with no other symptoms. Patient denies any pain or vision fevers chills or runny nose cough congestion. She has not tried any medication. She denies any sick contacts.   Past Medical History:  Diagnosis Date  . Hyperlipidemia    No past surgical history on file. Social History  Substance Use Topics  . Smoking status: Never Smoker  . Smokeless tobacco: Never Used  . Alcohol use No   family history includes Cancer in her father; Colon cancer in her maternal aunt; Stroke in her father.  ROS as above:  Medications: Current Outpatient Prescriptions  Medication Sig Dispense Refill  . albuterol (PROVENTIL HFA;VENTOLIN HFA) 108 (90 Base) MCG/ACT inhaler Inhale 2 puffs into the lungs every 6 (six) hours as needed for wheezing. 2 Inhaler 11  . clotrimazole (LOTRIMIN) 1 % cream Apply 1 application topically 2 (two) times daily. 60 g 0  . Naltrexone-Bupropion HCl ER 8-90 MG TB12 Take 1 tablet in am for 1 week, then increase to 1 tablet in am and 1 tablet in pm for 1 week, then increase to 2 tablets in am and 1 tablet in pm for 1 week, then increase to 2 tablets in am and 2 tablets in pm daily. 70 tablet 0  . terbinafine (LAMISIL) 250 MG tablet Take 1 tablet (250 mg total) by mouth daily. For two weeks 14 tablet 0   No current facility-administered medications for this visit.    No Known Allergies  Health Maintenance Health Maintenance  Topic Date Due  . TETANUS/TDAP  05/20/1979  . INFLUENZA VACCINE  04/02/2036 (Originally 10/09/2016)  . MAMMOGRAM  04/10/2018  . PAP SMEAR  08/03/2019  . COLONOSCOPY  08/09/2026  . Hepatitis C Screening  Completed  . HIV Screening  Completed     Exam:  BP 120/81   Pulse 83    Wt 204 lb (92.5 kg)   BMI 36.14 kg/m  Gen: Well NAD HEENT: EOMI,  MMM Left eye minimal conjunctiva injection otherwise normal Lungs: Normal work of breathing. CTABL Heart: RRR no MRG Abd: NABS, Soft. Nondistended, Nontender Exts: Brisk capillary refill, warm and well perfused.    No results found for this or any previous visit (from the past 72 hour(s)). No results found.    Assessment and Plan: 56 y.o. female with mild conjunctivitis likely allergic. Doubtful for severe viral conjunctivitis. Plan to use over-the-counter Zaditor. Recheck if not improved.   No orders of the defined types were placed in this encounter.  No orders of the defined types were placed in this encounter.    Discussed warning signs or symptoms. Please see discharge instructions. Patient expresses understanding.

## 2016-10-24 NOTE — Patient Instructions (Signed)
Thank you for coming in today. I do not think that you have the Bad kind of severe pink eye called Epidemic keratoconjunctivitis [facility]).  I think it is likely allergic.  You are ok to work as long as you are getting sick or worse.   Use over-the-counter Zaditor eyedrops (Ketotifen) Use over-the-counter Zyrtec (cetirizine)  Use Systane artificial tears as needed   Allergic Conjunctivitis, Adult Allergic conjunctivitis is inflammation of the clear membrane that covers the white part of your eye and the inner surface of your eyelid (conjunctiva). The inflammation is caused by allergies. The blood vessels in the conjunctiva become inflamed and this causes the eyes to become red or pink. The eyes often feel itchy. Allergic conjunctivitis cannot be spread from one person to another person (is not contagious). What are the causes? This condition is caused by an allergic reaction. Common causes of an allergic reaction (allergens) include:  Outdoor allergens, such as: ? Pollen. ? Grass and weeds. ? Mold spores.  Indoor allergens, such as: ? Dust. ? Smoke. ? Mold. ? Pet dander. ? Animal hair.  What increases the risk? You may be more likely to develop this condition if you have a family history of allergies, such as:  Allergic rhinitis.  Bronchial asthma.  Atopic dermatitis.  What are the signs or symptoms? Symptoms of this condition include eyes that are:  Itchy.  Red.  Watery.  Puffy.  Your eyes may also:  Sting or burn.  Have clear drainage coming from them.  How is this diagnosed? This condition may be diagnosed by medical history and physical exam. If you have drainage from your eyes, it may be tested to rule out other causes of conjunctivitis. You may also need to see a health care provider who specializes in treating allergies (allergist) or eye conditions (ophthalmologist) for tests to confirm the diagnosis. You may have:  Skin tests to see which allergens are  causing your symptoms. These tests involve pricking the skin with a tiny needle and exposing the skin to small amounts of potential allergens to see if your skin reacts.  Blood tests.  Tissue scrapings from your eyelid. These will be examined under a microscope.  How is this treated? Treatments for this condition may include:  Cold cloths (compresses) to soothe itching and swelling.  Washing the face to remove allergens.  Eye drops. These may be prescription or over-the-counter. There are several different types. You may need to try different types to see which one works best for you. Your may need: ? Eye drops that block the allergic reaction (antihistamine). ? Eye drops that reduce swelling and irritation (anti-inflammatory). ? Steroid eye drops to lessen a severe reaction (vernal conjunctivitis).  Oral antihistamine medicines to reduce your allergic reaction. You may need these if eye drops do not help or are difficult to use.  Follow these instructions at home:  Avoid known allergens whenever possible.  Take or apply over-the-counter and prescription medicines only as told by your health care provider. These include any eye drops.  Apply a cool, clean washcloth to your eye for 10-20 minutes, 3-4 times a day.  Do not touch or rub your eyes.  Do not wear contact lenses until the inflammation is gone. Wear glasses instead.  Do not wear eye makeup until the inflammation is gone.  Keep all follow-up visits as told by your health care provider. This is important. Contact a health care provider if:  Your symptoms get worse or do not improve  with treatment.  You have mild eye pain.  You have sensitivity to light.  You have spots or blisters on your eyes.  You have pus draining from your eye.  You have a fever. Get help right away if:  You have redness, swelling, or other symptoms in only one eye.  Your vision is blurred or you have vision changes.  You have severe  eye pain. This information is not intended to replace advice given to you by your health care provider. Make sure you discuss any questions you have with your health care provider. Document Released: 05/18/2002 Document Revised: 10/25/2015 Document Reviewed: 09/08/2015 Elsevier Interactive Patient Education  2018 [employer].

## 2017-03-05 ENCOUNTER — Ambulatory Visit: Payer: 59 | Admitting: Physician Assistant

## 2017-03-05 DIAGNOSIS — Z0189 Encounter for other specified special examinations: Secondary | ICD-10-CM

## 2017-03-10 ENCOUNTER — Encounter: Payer: Self-pay | Admitting: Physician Assistant

## 2017-03-10 ENCOUNTER — Ambulatory Visit (INDEPENDENT_AMBULATORY_CARE_PROVIDER_SITE_OTHER): Payer: 59 | Admitting: Physician Assistant

## 2017-03-10 VITALS — BP 128/73 | HR 95 | Ht 63.0 in | Wt 202.0 lb

## 2017-03-10 DIAGNOSIS — Z6835 Body mass index (BMI) 35.0-35.9, adult: Secondary | ICD-10-CM

## 2017-03-10 DIAGNOSIS — J0100 Acute maxillary sinusitis, unspecified: Secondary | ICD-10-CM

## 2017-03-10 DIAGNOSIS — Z808 Family history of malignant neoplasm of other organs or systems: Secondary | ICD-10-CM

## 2017-03-10 DIAGNOSIS — M545 Low back pain, unspecified: Secondary | ICD-10-CM

## 2017-03-10 DIAGNOSIS — T17320A Food in larynx causing asphyxiation, initial encounter: Secondary | ICD-10-CM

## 2017-03-10 DIAGNOSIS — R002 Palpitations: Secondary | ICD-10-CM | POA: Diagnosis not present

## 2017-03-10 DIAGNOSIS — R0981 Nasal congestion: Secondary | ICD-10-CM | POA: Insufficient documentation

## 2017-03-10 DIAGNOSIS — M546 Pain in thoracic spine: Secondary | ICD-10-CM

## 2017-03-10 DIAGNOSIS — M5030 Other cervical disc degeneration, unspecified cervical region: Secondary | ICD-10-CM | POA: Diagnosis not present

## 2017-03-10 DIAGNOSIS — R1310 Dysphagia, unspecified: Secondary | ICD-10-CM | POA: Diagnosis not present

## 2017-03-10 DIAGNOSIS — G8929 Other chronic pain: Secondary | ICD-10-CM

## 2017-03-10 DIAGNOSIS — M797 Fibromyalgia: Secondary | ICD-10-CM | POA: Diagnosis not present

## 2017-03-10 DIAGNOSIS — R1319 Other dysphagia: Secondary | ICD-10-CM | POA: Insufficient documentation

## 2017-03-10 MED ORDER — AMOXICILLIN-POT CLAVULANATE 875-125 MG PO TABS: 1.0000 | ORAL_TABLET | Freq: Two times a day (BID) | ORAL | 0 refills | Status: DC

## 2017-03-10 MED ORDER — LORCASERIN HCL ER 20 MG PO TB24: 1.0000 | ORAL_TABLET | Freq: Every day | ORAL | 2 refills | Status: DC

## 2017-03-10 MED ORDER — METHYLPREDNISOLONE SODIUM SUCC 125 MG IJ SOLR
125.0000 mg | Freq: Once | INTRAMUSCULAR | Status: AC
  Administered 2017-03-10: 125 mg via INTRAMUSCULAR

## 2017-03-10 MED ORDER — IPRATROPIUM BROMIDE 0.06 % NA SOLN: 2.0000 | Freq: Four times a day (QID) | NASAL | 0 refills | Status: DC

## 2017-03-10 NOTE — Progress Notes (Signed)
Subjective:    Patient ID: Olivia Miller, female    DOB: 04-14-1960, 56 y.o.   MRN: [id number]  HPI  Patient is a 56 yo  pleasant obese female with fibromyalgia, chronic neck/mid/low back pain who presents to the clinic with many concerns.   She never started contrave for weight loss. She remembers she did have seizures as a kid that she grew out of but concerned about increased risk as discussed with contrave.   She was evaluated by rheumatology and not found to have any autoimmune disease and was told she has fibromyalgia. She is concerned because she hurts so much all over but the most in her back. Her father had similar symptoms and eventually found bone cancer. He lived 30 days after dx. She wants to make sure she does not have bone cancer.   She had episode of choking a few weeks ago that also concerned her. She was eating an orange and chewed it well. It felt like it got stuck in her throat. She coughed for a while and finally threw if back up. She was very panicky when it happened. She has noticed for many months feeling like she was going to choke but only choking one time. She always has to chew her food very well to get it down. She had EGD a long time ago that showed hiatal hernia.   She is also concerned with palpitation feeling. At times she "feels like the beat is not normal". She feels this way many times a day for over the last 6 months. She denies any CP. She had normal carotid dopplers 2017 that were normal. 2016 normal stress test.   For the last week she has had nasal congestion and sinus pressure. No fever, chills, cough, SOB, or wheezing. She is taking advil and sudafed.   .. Active Ambulatory Problems    Diagnosis Date Noted  . Hyperlipidemia 03/03/2015  . Tachycardia 03/03/2015  . Left cervical radiculopathy 03/03/2015  . OAB (overactive bladder) 03/03/2015  . DDD (degenerative disc disease), cervical 03/03/2015  . Calcification of right carotid artery 06/07/2015   . Snoring 04/02/2016  . Apneic episode 04/02/2016  . Non-restorative sleep 04/02/2016  . Clostridium difficile colitis 04/18/2016  . Breast mass, right 04/23/2016  . Cervical polyp 08/06/2016  . Tinea pedis of left foot 08/06/2016  . Fibromyalgia 03/10/2017  . Nasal congestion 03/10/2017  . Palpitations 03/10/2017  . Esophageal dysphagia 03/10/2017  . Class 2 severe obesity due to excess calories with serious comorbidity and body mass index (BMI) of 35.0 to 35.9 in adult ([place]) 03/12/2017  . Family history of bone cancer 03/12/2017  . Chronic bilateral thoracic back pain 03/12/2017  . Chronic bilateral low back pain without sciatica 03/12/2017   Resolved Ambulatory Problems    Diagnosis Date Noted  . No Resolved Ambulatory Problems   Past Medical History:  Diagnosis Date  . Hyperlipidemia         Review of Systems    see HPI.  Objective:   Physical Exam  Constitutional: She is oriented to person, place, and time. She appears well-developed and well-nourished.  Obese.   HENT:  Head: Normocephalic and atraumatic.  Right Ear: External ear normal.  Left Ear: External ear normal.  Mouth/Throat: Oropharynx is clear and moist. No oropharyngeal exudate.  TM's clear.  Bilateral nares completely swollen shut and red.   Eyes: Conjunctivae are normal.  Neck: Normal range of motion. Neck supple.  Cardiovascular: Normal rate, regular rhythm and  normal heart sounds.  Pulmonary/Chest: Effort normal and breath sounds normal. She has no wheezes.  Lymphadenopathy:    She has no cervical adenopathy.  Neurological: She is alert and oriented to person, place, and time.  Psychiatric: She has a normal mood and affect. Her behavior is normal.          Assessment & Plan:  Marland KitchenMarland KitchenGavina was seen today for back pain and nasal congestion.  Diagnoses and all orders for this visit:  Esophageal dysphagia -     Ambulatory referral to Gastroenterology  Palpitations -     Holter monitor -  24 hour  Nasal congestion -     ipratropium (ATROVENT) 0.06 % nasal spray; Place 2 sprays into both nostrils 4 (four) times daily. -     methylPREDNISolone sodium succinate (SOLU-MEDROL) 125 mg/2 mL injection 125 mg  Fibromyalgia  Class 2 severe obesity due to excess calories with serious comorbidity and body mass index (BMI) of 35.0 to 35.9 in adult ([facility]) -     Lorcaserin HCl ER (BELVIQ XR) 20 MG TB24; Take 1 tablet by mouth daily.  Family history of bone cancer  DDD (degenerative disc disease), cervical  Chronic bilateral low back pain without sciatica  Chronic bilateral thoracic back pain  Acute non-recurrent maxillary sinusitis -     amoxicillin-clavulanate (AUGMENTIN) 875-125 MG tablet; Take 1 tablet by mouth 2 (two) times daily. For 10 days.  Choking due to food in larynx, initial encounter -     Ambulatory referral to Gastroenterology   Made referral to GI to consider EGD to rule out esophageal stricture or need for dilation.   Will get patient set up with holter monitor for palpitations. Discussed causes. HO given.   Offered to get xray of spine. She would like to hold off until beginning of next year.   Solumedrol and atrovent given for nasal congestion. Seems like symptoms could be evolving into sinusitis. augmentin given to start if symptoms do not improve in next 3 to 4 days. HO given.   Try belviq for weight loss. Discussed side effects. Follow up in 1 month.  [name].Discussed low carb diet with 1500 calories and 80g of protein.  Exercising at least 150 minutes a week.  [employer] could be a Microbiologist.

## 2017-03-10 NOTE — Patient Instructions (Addendum)
Tumeric 500mg  twice a day.   Cymbalta, gabapentin, lyrica, effexor, muscle spasm.   corcidin is good for blood pressure.    Upper Respiratory Infection, Adult Most upper respiratory infections (URIs) are caused by a virus. A URI affects the nose, throat, and upper air passages. The most common type of URI is often called "the common cold." Follow these instructions at home:  Take medicines only as told by your doctor.  Gargle warm saltwater or take cough drops to comfort your throat as told by your doctor.  Use a warm mist humidifier or inhale steam from a shower to increase air moisture. This may make it easier to breathe.  Drink enough fluid to keep your pee (urine) clear or pale yellow.  Eat soups and other clear broths.  Have a healthy diet.  Rest as needed.  Go back to work when your fever is gone or your doctor says it is okay. ? You may need to stay home longer to avoid giving your URI to others. ? You can also wear a face mask and wash your hands often to prevent spread of the virus.  Use your inhaler more if you have asthma.  Do not use any tobacco products, including cigarettes, chewing tobacco, or electronic cigarettes. If you need help quitting, ask your doctor. Contact a doctor if:  You are getting worse, not better.  Your symptoms are not helped by medicine.  You have chills.  You are getting more short of breath.  You have brown or red mucus.  You have yellow or brown discharge from your nose.  You have pain in your face, especially when you bend forward.  You have a fever.  You have puffy (swollen) neck glands.  You have pain while swallowing.  You have white areas in the back of your throat. Get help right away if:  You have very bad or constant: ? Headache. ? Ear pain. ? Pain in your forehead, behind your eyes, and over your cheekbones (sinus pain). ? Chest pain.  You have long-lasting (chronic) lung disease and any of the  following: ? Wheezing. ? Long-lasting cough. ? Coughing up blood. ? A change in your usual mucus.  You have a stiff neck.  You have changes in your: ? Vision. ? Hearing. ? Thinking. ? Mood. This information is not intended to replace advice given to you by your health care provider. Make sure you discuss any questions you have with your health care provider. Document Released: 08/14/2007 Document Revised: 10/29/2015 Document Reviewed: 06/02/2013 Elsevier Interactive Patient Education  2018 [employer].   Palpitations A palpitation is the feeling that your heart:  Has an uneven (irregular) heartbeat.  Is beating faster than normal.  Is fluttering.  Is skipping a beat.  This is usually not a serious problem. In some cases, you may need more medical tests. Follow these instructions at home:  Avoid: ? Caffeine in coffee, tea, soft drinks, diet pills, and energy drinks. ? Chocolate. ? Alcohol.  Do not use any tobacco products. These include cigarettes, chewing tobacco, and e-cigarettes. If you need help quitting, ask your doctor.  Try to reduce your stress. These things may help: ? Yoga. ? Meditation. ? Physical activity. Swimming, jogging, and walking are good choices. ? A method that helps you use your mind to control things in your body, like heartbeats (biofeedback).  Get plenty of rest and sleep.  Take over-the-counter and prescription medicines only as told by your doctor.  Keep all  follow-up visits as told by your doctor. This is important. Contact a doctor if:  Your heartbeat is still fast or uneven after 24 hours.  Your palpitations occur more often. Get help right away if:  You have chest pain.  You feel short of breath.  You have a very bad headache.  You feel dizzy.  You pass out (faint). This information is not intended to replace advice given to you by your health care provider. Make sure you discuss any questions you have with your health  care provider. Document Released: 12/05/2007 Document Revised: 08/03/2015 Document Reviewed: 11/10/2014 Elsevier Interactive Patient Education  [employer].

## 2017-03-12 ENCOUNTER — Telehealth: Payer: Self-pay | Admitting: *Deleted

## 2017-03-12 DIAGNOSIS — G8929 Other chronic pain: Secondary | ICD-10-CM | POA: Insufficient documentation

## 2017-03-12 DIAGNOSIS — M545 Low back pain, unspecified: Secondary | ICD-10-CM | POA: Insufficient documentation

## 2017-03-12 DIAGNOSIS — Z808 Family history of malignant neoplasm of other organs or systems: Secondary | ICD-10-CM | POA: Insufficient documentation

## 2017-03-12 DIAGNOSIS — E6609 Other obesity due to excess calories: Secondary | ICD-10-CM | POA: Insufficient documentation

## 2017-03-12 DIAGNOSIS — M546 Pain in thoracic spine: Secondary | ICD-10-CM

## 2017-03-12 DIAGNOSIS — Z6835 Body mass index (BMI) 35.0-35.9, adult: Secondary | ICD-10-CM

## 2017-03-12 NOTE — Telephone Encounter (Signed)
Pre Authorization sent to cover my meds. [id number]

## 2017-03-12 NOTE — Telephone Encounter (Signed)
belviq is on plans list of covered drugs. No PA is needed

## 2017-07-28 ENCOUNTER — Other Ambulatory Visit: Payer: Self-pay | Admitting: Physician Assistant

## 2017-07-28 DIAGNOSIS — N63 Unspecified lump in unspecified breast: Secondary | ICD-10-CM

## 2017-08-06 ENCOUNTER — Ambulatory Visit
Admission: RE | Admit: 2017-08-06 | Discharge: 2017-08-06 | Disposition: A | Discharge status: Home / Self Care | Payer: 59 | Source: Ambulatory Visit | Attending: Physician Assistant | Admitting: Physician Assistant

## 2017-08-06 DIAGNOSIS — N63 Unspecified lump in unspecified breast: Secondary | ICD-10-CM

## 2017-08-13 ENCOUNTER — Encounter: Payer: Self-pay | Admitting: Obstetrics & Gynecology

## 2017-08-13 ENCOUNTER — Ambulatory Visit (INDEPENDENT_AMBULATORY_CARE_PROVIDER_SITE_OTHER): Payer: 59 | Admitting: Obstetrics & Gynecology

## 2017-08-13 VITALS — BP 140/93 | HR 83 | Wt 194.0 lb

## 2017-08-13 DIAGNOSIS — Z1151 Encounter for screening for human papillomavirus (HPV): Secondary | ICD-10-CM | POA: Diagnosis not present

## 2017-08-13 DIAGNOSIS — Z01419 Encounter for gynecological examination (general) (routine) without abnormal findings: Secondary | ICD-10-CM | POA: Diagnosis not present

## 2017-08-13 DIAGNOSIS — Z124 Encounter for screening for malignant neoplasm of cervix: Secondary | ICD-10-CM

## 2017-08-13 DIAGNOSIS — N852 Hypertrophy of uterus: Secondary | ICD-10-CM

## 2017-08-13 DIAGNOSIS — R32 Unspecified urinary incontinence: Secondary | ICD-10-CM

## 2017-08-13 DIAGNOSIS — R151 Fecal smearing: Secondary | ICD-10-CM

## 2017-08-13 NOTE — Addendum Note (Signed)
Addended by: Asencion Islam on: 08/13/2017 11:09 AM   Modules accepted: Orders

## 2017-08-13 NOTE — Progress Notes (Addendum)
Subjective:    Olivia Miller is a 57 y.o. married P4 (37, 58, 94, and 73 yo kids, 90 grands) female who presents for an annual exam. She wants to know is her bladder is dropping, she has urinary incontinence, with coughing and sneezing and in the middle of the night (wet underwear at night), She also has some rectal incontinence with coughing and sneezing.  The patient is sexually active. GYN screening history: last pap: was normal. The patient wears seatbelts: yes. The patient participates in regular exercise: no. Has the patient ever been transfused or tattooed?: no. The patient reports that there is not domestic violence in her life.   Menstrual History: OB History    Gravida  4   Para  4   Term  4   Preterm      AB      Living  4     SAB      TAB      Ectopic      Multiple      Live Births              Menarche age: 34 Patient's last menstrual period was 05/21/2015 (within days).    The following portions of the patient's history were reviewed and updated as appropriate: allergies, current medications, past family history, past medical history, past social history, past surgical history and problem list.  Review of Systems Pertinent items are noted in HPI.   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 She had a colonoscopy about 2017 Had mammogram last week   Objective:    BP (!) 140/93   Pulse 83   Wt 194 lb (88 kg)   LMP 05/21/2015 (Within Days)   BMI 34.37 kg/m   General Appearance:    Alert, cooperative, no distress, appears stated age  Head:    Normocephalic, without obvious abnormality, atraumatic  Eyes:    PERRL, conjunctiva/corneas clear, EOM's intact, fundi    benign, both eyes  Ears:    Normal TM's and external ear canals, both ears  Nose:   Nares normal, septum midline, mucosa normal, no drainage    or sinus  tenderness  Throat:   Lips, mucosa, and tongue normal; teeth and gums normal  Neck:   Supple, symmetrical, trachea midline, no adenopathy;    thyroid:  no enlargement/tenderness/nodules; no carotid   bruit or JVD  Back:     Symmetric, no curvature, ROM normal, no CVA tenderness  Lungs:     Clear to auscultation bilaterally, respirations unlabored  Chest Wall:    No tenderness or deformity   Heart:    Regular rate and rhythm, S1 and S2 normal, no murmur, rub   or gallop  Breast Exam:    No tenderness, masses, or nipple abnormality  Abdomen:     Soft, non-tender, bowel sounds active all four quadrants,    no masses, no organomegaly  Genitalia:    Normal female without lesion, discharge or tenderness, upper limit of normal size and shape, anteverted, mobile, non-tender, normal adnexal exam, rectocele noted, hemorrhoids around anus noted       Extremities:   Extremities normal, atraumatic, no cyanosis or edema  Pulses:   2+ and symmetric all extremities  Skin:   Skin color, texture, turgor normal, no rashes or lesions  Lymph nodes:   Cervical, supraclavicular, and  axillary nodes normal  Neurologic:   CNII-XII intact, normal strength, sensation and reflexes    throughout  .    Assessment:    Healthy female exam.   Urinary and fecal incontinence Enlarged uterus   Plan:     Thin prep Pap smear. with cotesting Declines TDAP Refer to Dr. [place] Pink Gyn u/s ordered

## 2017-08-13 NOTE — Addendum Note (Signed)
Addended by: Emily Filbert on: 08/13/2017 09:58 AM   Modules accepted: Orders

## 2017-08-14 ENCOUNTER — Ambulatory Visit (INDEPENDENT_AMBULATORY_CARE_PROVIDER_SITE_OTHER): Payer: 59

## 2017-08-14 DIAGNOSIS — D259 Leiomyoma of uterus, unspecified: Secondary | ICD-10-CM

## 2017-08-14 DIAGNOSIS — N852 Hypertrophy of uterus: Secondary | ICD-10-CM

## 2017-08-15 LAB — CYTOLOGY - PAP
Diagnosis: NEGATIVE
HPV (WINDOPATH): NOT DETECTED

## 2018-02-04 ENCOUNTER — Encounter: Payer: Self-pay | Admitting: Physician Assistant

## 2018-02-04 ENCOUNTER — Other Ambulatory Visit: Payer: Self-pay | Admitting: Physician Assistant

## 2018-02-04 ENCOUNTER — Ambulatory Visit (INDEPENDENT_AMBULATORY_CARE_PROVIDER_SITE_OTHER): Payer: 59

## 2018-02-04 ENCOUNTER — Ambulatory Visit (INDEPENDENT_AMBULATORY_CARE_PROVIDER_SITE_OTHER): Payer: 59 | Admitting: Physician Assistant

## 2018-02-04 VITALS — BP 126/82 | HR 93 | Temp 98.7°F | Wt 201.2 lb

## 2018-02-04 DIAGNOSIS — R059 Cough, unspecified: Secondary | ICD-10-CM

## 2018-02-04 DIAGNOSIS — M25561 Pain in right knee: Secondary | ICD-10-CM

## 2018-02-04 DIAGNOSIS — G8929 Other chronic pain: Secondary | ICD-10-CM

## 2018-02-04 DIAGNOSIS — N816 Rectocele: Secondary | ICD-10-CM

## 2018-02-04 DIAGNOSIS — J0140 Acute pansinusitis, unspecified: Secondary | ICD-10-CM

## 2018-02-04 DIAGNOSIS — R1319 Other dysphagia: Secondary | ICD-10-CM

## 2018-02-04 DIAGNOSIS — M25562 Pain in left knee: Principal | ICD-10-CM

## 2018-02-04 DIAGNOSIS — M1710 Unilateral primary osteoarthritis, unspecified knee: Secondary | ICD-10-CM | POA: Insufficient documentation

## 2018-02-04 DIAGNOSIS — N8110 Cystocele, unspecified: Secondary | ICD-10-CM | POA: Insufficient documentation

## 2018-02-04 DIAGNOSIS — R05 Cough: Secondary | ICD-10-CM

## 2018-02-04 DIAGNOSIS — R1310 Dysphagia, unspecified: Secondary | ICD-10-CM

## 2018-02-04 MED ORDER — PREDNISONE 50 MG PO TABS: ORAL_TABLET | ORAL | 0 refills | Status: DC

## 2018-02-04 MED ORDER — AMOXICILLIN-POT CLAVULANATE 875-125 MG PO TABS: 1.0000 | ORAL_TABLET | Freq: Two times a day (BID) | ORAL | 0 refills | Status: DC

## 2018-02-04 MED ORDER — OMEPRAZOLE 40 MG PO CPDR: 40.0000 mg | DELAYED_RELEASE_CAPSULE | Freq: Every day | ORAL | 3 refills | Status: DC

## 2018-02-04 NOTE — Progress Notes (Signed)
Call pt: knee xrays appear stable but with arthritis. Follow up with sports medicine for injections and treatment plan. We should also discuss weight loss at next visit as it can help with knee pain.

## 2018-02-04 NOTE — Patient Instructions (Addendum)
Anterior and Posterior Colporrhaphy Anterior or posterior colporrhaphy is surgery to fix a prolapse of organs in the genital tract. Prolapse means the falling down, bulging, dropping, or drooping of an organ. Organs that commonly prolapse include the rectum, bladder, vagina, and uterus. Prolapse can affect a single organ or several organs at the same time. This often worsens when women stop having their monthly periods (menopause) because estrogen loss weakens the muscles and tissues in the genital tract. In addition, prolapse happens when the organs are damaged or weakened. This commonly happens after childbirth and as a result of aging. Surgery is often done for severe prolapses. The type of colporrhaphy done depends on the type of genital prolapse. Types of genital prolapse include the following:  Cystocele. This is a prolapse of the upper (anterior) wall of the vagina. The anterior wall bulges into the vagina and brings the bladder with it.  Rectocele. This is a prolapse of the lower (posterior) wall of the vagina. The posterior vaginal wall bulges into the vagina and brings the rectum with it.  Enterocele. This is a prolapse of part of the pelvic organs called the pouch of Douglas. It also involves a portion of the small bowel. It appears as a bulge under the neck of the uterus at the top of the back wall of the vagina.  Procidentia. This is a complete prolapse of the uterus and the cervix. The prolapse can be seen and felt coming out of the vagina.  LET [facility] CARE PROVIDER KNOW ABOUT:  Any allergies you have.  All medicines you are taking, including vitamins, herbs, eye drops, creams, and over-the-counter medicines.  Previous problems you or members of your family have had with the use of anesthetics.  Any blood disorders you have.  Previous surgeries you have had.  Medical conditions you have.  Smoking history or history of alcohol use.  Possibility of pregnancy, if this  applies. RISKS AND COMPLICATIONS Generally, anterior or posterior colporrhaphy is a safe procedure. However, as with any procedure, complications can occur. Possible complications include:  Infection.  Damage to other organs during surgery.  Bleeding after surgery.  Problems urinating.  Problems from the anesthetic.  BEFORE THE PROCEDURE  Ask your health care provider about changing or stopping your regular medicines.  Do not eat or drink anything for at least 8 hours before the surgery.  If you smoke, do not smoke for at least 2 weeks before the surgery.  Make plans to have someone drive you home after your hospital stay. Also, arrange for someone to help you with activities during recovery. PROCEDURE You may be given medicine to help you relax before the surgery (sedative). During the surgery you will be given medicine to make you sleep through the procedure (general anesthetic) or medicine to numb you from the waist down (spinal anesthetic). This medicine will be given through an intravenous (IV) access tube that is put into one of your veins. The procedure will vary depending on the type of repair:  Anterior repair. A cut (incision) is made in the midline section of the front part of the vaginal wall. A triangular-shaped piece of vaginal tissue is removed, and the stronger, healthier tissue is sewn together in order to support and suspend the bladder.  Posterior repair. An incision is made midline on the back wall of the vagina. A triangular portion of vaginal skin is removed to expose the muscle. Excess tissue is removed, and stronger, healthier muscle and ligament tissue is  sewn together to support the rectum.  Anterior and posterior repair. Both procedures are done during the same surgery.  What to expect after the procedure You will be taken to a recovery area. Your blood pressure, pulse, breathing, and temperature (vital signs) will be monitored. This is done until you are  stable. Then you will be transferred to a hospital room. After surgery, you will have a small rubber tube in place to drain your bladder (urinary catheter). This will be in place for 2 to 7 days or until your bladder is working properly on its own. The IV access tube will be removed in 1 to 3 days. You may have a gauze packing in your vagina to prevent bleeding. This will be removed 2 or 3 days after the surgery. You will likely need to stay in the hospital for 3 to 5 days. This information is not intended to replace advice given to you by your health care provider. Make sure you discuss any questions you have with your health care provider. Document Released: 05/18/2003 Document Revised: 08/03/2015 Document Reviewed: 07/17/2012 Elsevier Interactive Patient Education  2017 [place].  Kegel Exercises Kegel exercises help strengthen the muscles that support the rectum, vagina, small intestine, bladder, and uterus. Doing Kegel exercises can help:  Improve bladder and bowel control.  Improve sexual response.  Reduce problems and discomfort during pregnancy.  Kegel exercises involve squeezing your pelvic floor muscles, which are the same muscles you squeeze when you try to stop the flow of urine. The exercises can be done while sitting, standing, or lying down, but it is best to vary your position. Phase 1 exercises 1. Squeeze your pelvic floor muscles tight. You should feel a tight lift in your rectal area. If you are a female, you should also feel a tightness in your vaginal area. Keep your stomach, buttocks, and legs relaxed. 2. Hold the muscles tight for up to 10 seconds. 3. Relax your muscles. Repeat this exercise 50 times a day or as many times as told by your health care provider. Continue to do this exercise for at least 4-6 weeks or for as long as told by your health care provider. This information is not intended to replace advice given to you by your health care provider. Make sure  you discuss any questions you have with your health care provider. Document Released: 02/12/2012 Document Revised: 10/21/2015 Document Reviewed: 01/15/2015 Elsevier Interactive Patient Education  [employer].

## 2018-02-04 NOTE — Progress Notes (Signed)
Subjective:    Patient ID: Olivia Miller, female    DOB: 1961-01-14, 57 y.o.   MRN: [id number]  HPI  Patient is a 57 year old female who presents to the clinic with intermittent sinus pressure and cough for the last 8 weeks.  Originally she felt like she had a cold and did improve significantly.  Her symptoms lingered and off and on they would be worse and better.  She has a dry cough that has remained.  Occasionally she will feel like there is some sinus drainage going down the back of her throat.  She denies any fever, chills, body aches, shortness of breath, wheezing.  She is tried over-the-counter cold and cough preparations with some initial relief but no improvement of her cough.  She does not use any nasal sprays.  She does want to discuss my opinion on her rectocele/cystocele procedure.  She met with urology and they do want to do a surgical rectocele repair.  She is also concerned because of her multiple arthralgias.  Her father had multiple arthralgias and eventually died of bone cancer.  She is concerned about this.  Her knees are particularly bothersome.  She is not doing anything to make better at this time.  She also mentions her heel hurting as well.  She also questions not being called by gastroenterology for an appointment.  She continues to have problems swallowing.  She has to chew her food multiple times so that she will not choke.   .. Active Ambulatory Problems    Diagnosis Date Noted  . Hyperlipidemia 03/03/2015  . Tachycardia 03/03/2015  . Left cervical radiculopathy 03/03/2015  . OAB (overactive bladder) 03/03/2015  . DDD (degenerative disc disease), cervical 03/03/2015  . Calcification of right carotid artery 06/07/2015  . Snoring 04/02/2016  . Apneic episode 04/02/2016  . Non-restorative sleep 04/02/2016  . Clostridium difficile colitis 04/18/2016  . Breast mass, right 04/23/2016  . Cervical polyp 08/06/2016  . Tinea pedis of left foot 08/06/2016  .  Fibromyalgia 03/10/2017  . Nasal congestion 03/10/2017  . Palpitations 03/10/2017  . Esophageal dysphagia 03/10/2017  . Class 2 severe obesity due to excess calories with serious comorbidity and body mass index (BMI) of 35.0 to 35.9 in adult ([place]) 03/12/2017  . Family history of bone cancer 03/12/2017  . Chronic bilateral thoracic back pain 03/12/2017  . Chronic bilateral low back pain without sciatica 03/12/2017  . Female cystocele 02/04/2018  . Rectocele 02/04/2018  . Chronic pain of both knees 02/04/2018   Resolved Ambulatory Problems    Diagnosis Date Noted  . No Resolved Ambulatory Problems   No Additional Past Medical History      Review of Systems See HPI.     Objective:   Physical Exam  Constitutional: She is oriented to person, place, and time. She appears well-developed and well-nourished.  HENT:  Head: Normocephalic and atraumatic.  Right Ear: External ear normal.  Left Ear: External ear normal.  Mouth/Throat: Oropharynx is clear and moist.  TM's clear.  Oropharynx erythematous with PND.  Bilateral nasal turbinates swollen and red.    Eyes: Conjunctivae are normal. Right eye exhibits no discharge. Left eye exhibits no discharge.  Cardiovascular: Normal rate and regular rhythm.  Pulmonary/Chest: Effort normal and breath sounds normal.  Lymphadenopathy:    She has cervical adenopathy.  Neurological: She is alert and oriented to person, place, and time.  Psychiatric: She has a normal mood and affect. Her behavior is normal.  Assessment & Plan:  Marland KitchenMarland KitchenMattea was seen today for cough.  Diagnoses and all orders for this visit:  Subacute pansinusitis -     predniSONE (DELTASONE) 50 MG tablet; One tab PO daily for 5 days. -     amoxicillin-clavulanate (AUGMENTIN) 875-125 MG tablet; Take 1 tablet by mouth 2 (two) times daily.  Chronic pain of both knees -     Cancel: DG Knee Bilateral Standing AP -     DG Knee Complete 4 Views  Right  Rectocele  Female cystocele  Cough -     predniSONE (DELTASONE) 50 MG tablet; One tab PO daily for 5 days. -     amoxicillin-clavulanate (AUGMENTIN) 875-125 MG tablet; Take 1 tablet by mouth 2 (two) times daily.  Esophageal dysphagia -     omeprazole (PRILOSEC) 40 MG capsule; Take 1 capsule (40 mg total) by mouth daily.   I do think patient has a subacute sinusitis.  Treated with Augmentin and prednisone burst.  Discussed other symptomatic care as well.  If her coughing does not subside I would also consider adding omeprazole daily.  I think there could be an acid reflux/dysphasia component to her persistent cough.  I confirmed that I did make the previous gastroenterology referral but since she was never called I will make another one today.  I do think she would benefit from an EGD.  Discussed cystocele and rectocele repairs.  Certainly think they are less invasive than most surgical procedures however there is always a risk.  Encouraged her to consider risk versus benefit.  Currently right now her only problem is feeling like she has to have a bowel movement during intercourse.  She says this does not affect her ability to climax and or how much pleasure she gets from this.  I stated if she is not affected by her side effects then I would not do surgery until she had 2.  I did not have time to go in depth with her orthopedic issues today.  I did order bilateral knee x-rays since that has been the worst pain for patient.  Certainly her symptoms seem consistent with knee arthritis.  Encouraged her to start to [place] daily.  I also think if the x-rays confirm arthritis we could consider referral to sports medicine to help working on her aches and pains.  Certainly knee injections and even orthotics for her heel pain could greatly benefit her.  Follow up in the next month.   [name].Spent 40 minutes with patient and greater than 50 percent of visit spent counseling patient regarding treatment  plan.

## 2018-02-16 ENCOUNTER — Encounter: Payer: Self-pay | Admitting: Family Medicine

## 2018-02-16 ENCOUNTER — Ambulatory Visit (INDEPENDENT_AMBULATORY_CARE_PROVIDER_SITE_OTHER): Payer: 59 | Admitting: Family Medicine

## 2018-02-16 VITALS — BP 136/85 | HR 78 | Ht 63.0 in | Wt 194.0 lb

## 2018-02-16 DIAGNOSIS — M722 Plantar fascial fibromatosis: Secondary | ICD-10-CM | POA: Insufficient documentation

## 2018-02-16 DIAGNOSIS — M25562 Pain in left knee: Secondary | ICD-10-CM

## 2018-02-16 DIAGNOSIS — M170 Bilateral primary osteoarthritis of knee: Secondary | ICD-10-CM | POA: Diagnosis not present

## 2018-02-16 DIAGNOSIS — M25561 Pain in right knee: Secondary | ICD-10-CM | POA: Diagnosis not present

## 2018-02-16 DIAGNOSIS — Z6835 Body mass index (BMI) 35.0-35.9, adult: Secondary | ICD-10-CM

## 2018-02-16 DIAGNOSIS — G8929 Other chronic pain: Secondary | ICD-10-CM

## 2018-02-16 MED ORDER — DICLOFENAC SODIUM 1 % TD GEL: 4.0000 g | Freq: Four times a day (QID) | TRANSDERMAL | 11 refills | Status: DC

## 2018-02-16 NOTE — Progress Notes (Signed)
a  Subjective:    I'm seeing this patient as a consultation for:  Olivia Miller   CC: Knee pain bilaterally and right foot pain  HPI:  [name] was seen by her primary care provider on November 27.  At that visit she noted knee pain bilaterally.  Her right knee is worse than her left.  She notes pain in the anterior knee worse with climbing stairs and prolonged knee flexion.  Pain is improved with relative rest.  She has been working on weight loss and quad strengthening exercises which have helped.  Additionally she will take over-the-counter medicines for pain as needed including Tylenol ibuprofen and Aleve.  This helps a bit.   Additionally she notes right plantar foot pain.  She has been told by podiatrist this is a heel spur and plantar fasciitis.  She has had several steroid injections with little prolonged benefit.  She notes pain is worse with activity and better with rest.  She notes pain when she first gets out of bed in the morning.  Past medical history, Surgical history, Family history not pertinant except as noted below, Social history, Allergies, and medications have been entered into the medical record, reviewed, and no changes needed.   Review of Systems: No headache, visual changes, nausea, vomiting, diarrhea, constipation, dizziness, abdominal pain, skin rash, fevers, chills, night sweats, weight loss, swollen lymph nodes, body aches, joint swelling, muscle aches, chest pain, shortness of breath, mood changes, visual or auditory hallucinations.   Objective:    Vitals:   02/16/18 0743  BP: 136/85  Pulse: 78   General: Well Developed, well nourished, and in no acute distress.  Neuro/Psych: Alert and oriented x3, extra-ocular muscles intact, able to move all 4 extremities, sensation grossly intact. Skin: Warm and dry, no rashes noted.  Respiratory: Not using accessory muscles, speaking in full sentences, trachea midline.  Cardiovascular: Pulses palpable, no  extremity edema. Abdomen: Does not appear distended. MSK:  Right knee relatively normal-appearing with no effusion. Range of motion 0-120 degrees with retropatellar crepitations. Minimally tender medial joint line. Stable ligamentous exam. Negative McMurray's test. Intact flexion and extension strength.  Left knee normal-appearing no effusion Range of motion 0 to-120 degrees with retropatellar crepitations. Nontender. Stable ligamentous exam. Negative McMurray's test. Intact flexion and extension strength.  Right foot normal-appearing tender palpation plantar medial calcaneus.  Normal foot and ankle motion.  Normal strength.  Pulses capillary refill and sensation are intact distally.  Lab and Radiology Results EXAM: RIGHT KNEE - COMPLETE 4+ VIEW;  LEFT KNEE - COMPLETE 4+ VIEW  COMPARISON:  06/02/2015.  FINDINGS: Bone mineralization is within normal limits. Joint spaces remain stable since 2017, with only mild bilateral medial compartment joint space loss on standing images. No joint effusion. No acute osseous abnormality identified.  IMPRESSION: Stable since 2017 with mild bilateral medial compartment joint space loss.   Electronically Signed   By: Genevie Ann M.D.   On: 02/04/2018 14:50 I personally (independently) visualized and performed the interpretation of the images attached in this note.  Impression and Recommendations:    Assessment and Plan: 57 y.o. female with  Bilateral knee pain right worse than left.  Mild degenerative changes with retropatellar crepitations.  Patient has DJD and patellofemoral chondromalacia.  Plan for quad strengthening exercises, weight loss, and diclofenac gel.  Discussed specifically some exercise options and weight loss strategies that should be helpful.  Right foot pain: Plantar fasciitis.  Discussed eccentric exercises and ice massage.  Weight loss  will also be helpful as well..  Recheck 6 weeks.   No orders of the defined  types were placed in this encounter.  Meds ordered this encounter  Medications  . diclofenac sodium (VOLTAREN) 1 % GEL    Sig: Apply 4 g topically 4 (four) times daily. To affected joint.    Dispense:  100 g    Refill:  11    Discussed warning signs or symptoms. Please see discharge instructions. Patient expresses understanding.

## 2018-02-16 NOTE — Patient Instructions (Signed)
Thank you for coming in today. For the heels do the ice massage. Use a big ice cube by freezing water in a stryfoam cup.   Do the heel exercises go from the up to down position slowly.   For knees continue quad exercises. Exercise bike and elliptical are great.    Use diclofenac gel up to 4x daily as needed.    Recheck in 6 weeks.  Return sooner if needed.   Keep the carbs low.  Try to get fewer than 60g of carbs per meal.   Consider an antiinflammatory diet.

## 2018-06-21 IMAGING — CT CT MAXILLOFACIAL W/O CM
3 series · 15 of 33 positions shown, 18 images · non-contrast
Comparison: None.

CLINICAL DATA: Persistent maxillary pain after teeth removal.

EXAM:
CT MAXILLOFACIAL WITHOUT CONTRAST
TECHNIQUE: Multidetector CT imaging of the maxillofacial structures was
performed. Multiplanar CT image reconstructions were also generated.
A small metallic BB was placed on the right temple in order to
reliably differentiate right from left.

[Series 3: ax soft · axial · 0.31mm/px · z∈[-138,-28]mm · 9 of 65 slices shown, 12 images]
[im 5/65  soft-tissue]
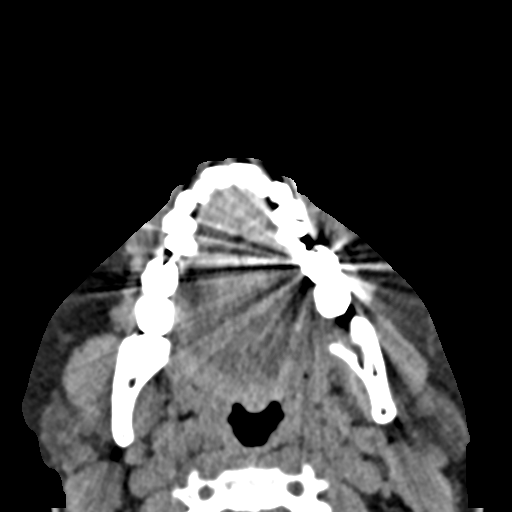
[im 5/65  bone]
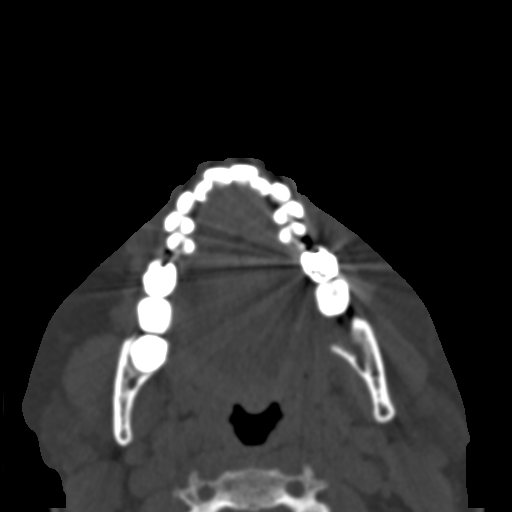
[im 15/65  bone]
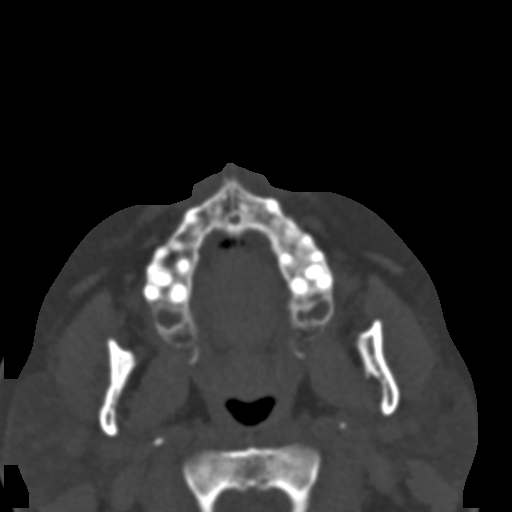
[im 20/65  bone]
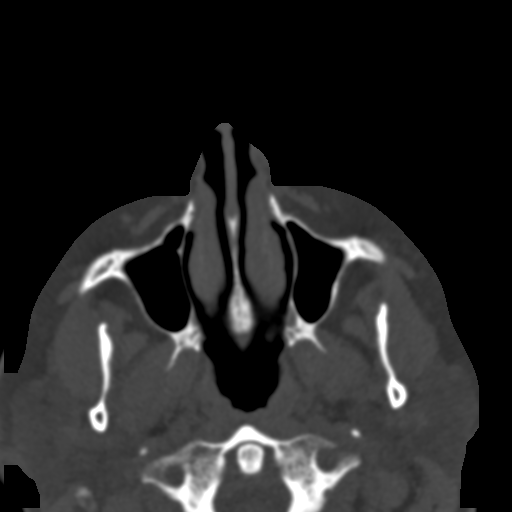
[im 25/65  bone]
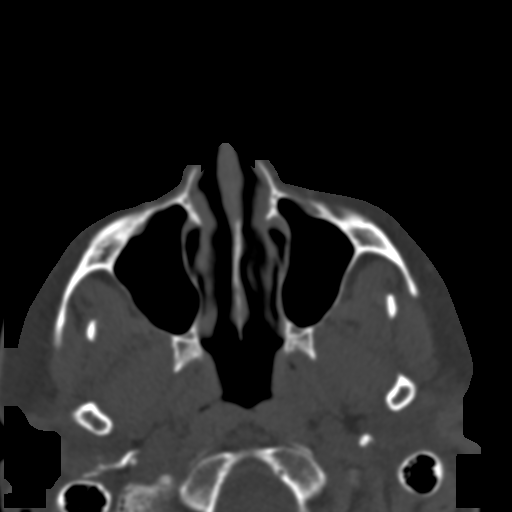
[im 35/65  soft-tissue]
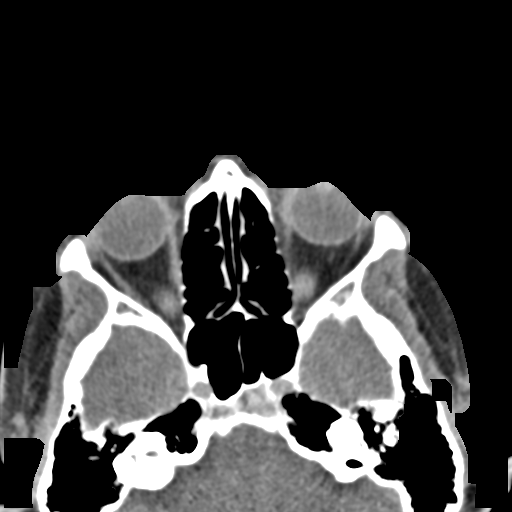
[im 35/65  bone]
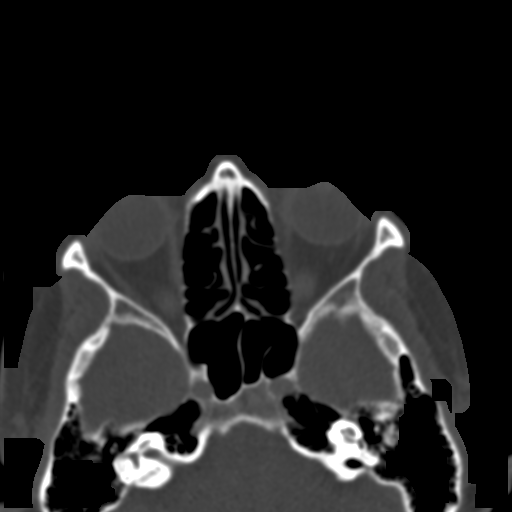
[im 40/65  bone]
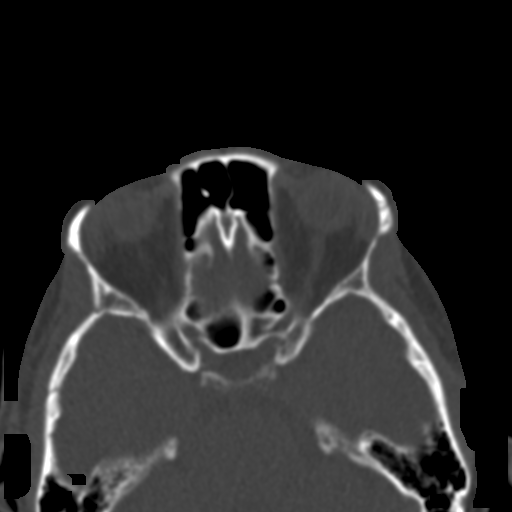
[im 45/65  bone]
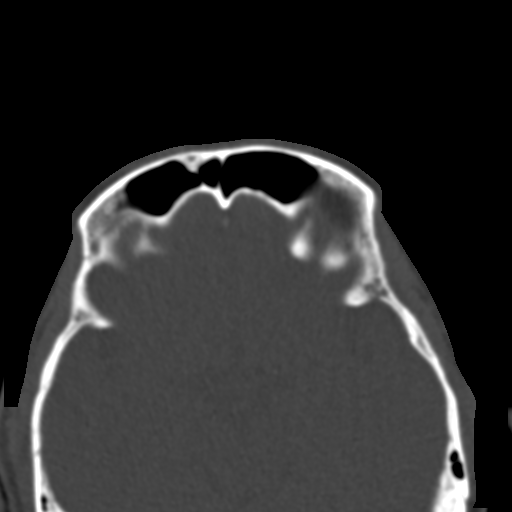
[im 55/65  bone]
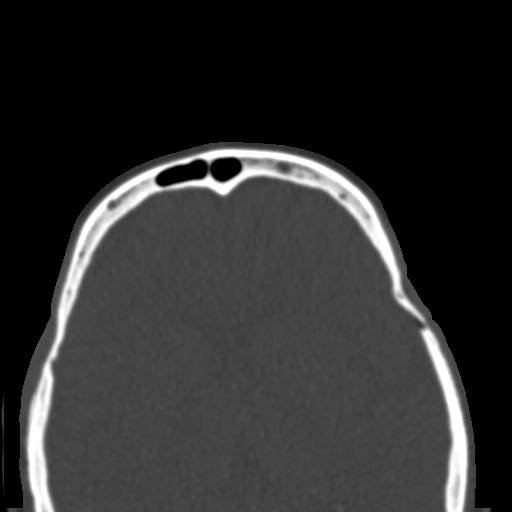
[im 60/65  soft-tissue]
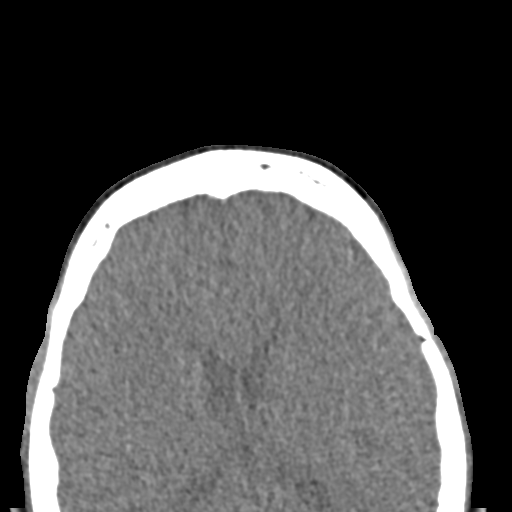
[im 60/65  bone]
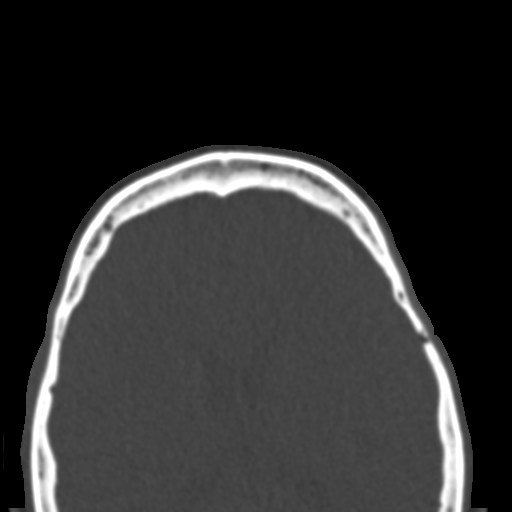

[Series 4: coronal bone · coronal · 0.28mm/px · 3 of 78 slices shown]
[im 16/78  bone]
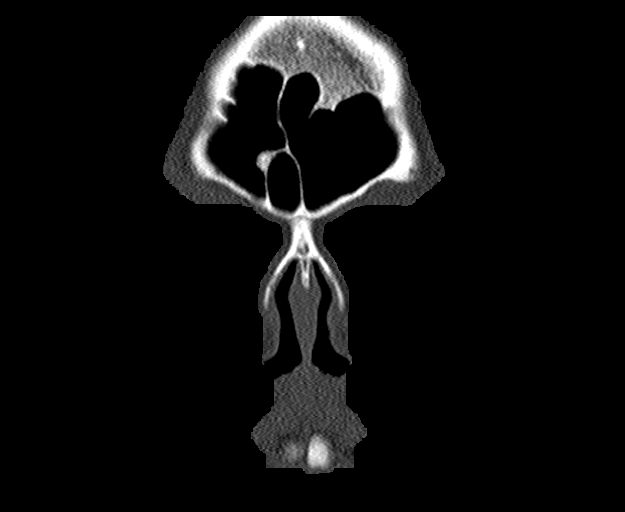
[im 31/78  bone]
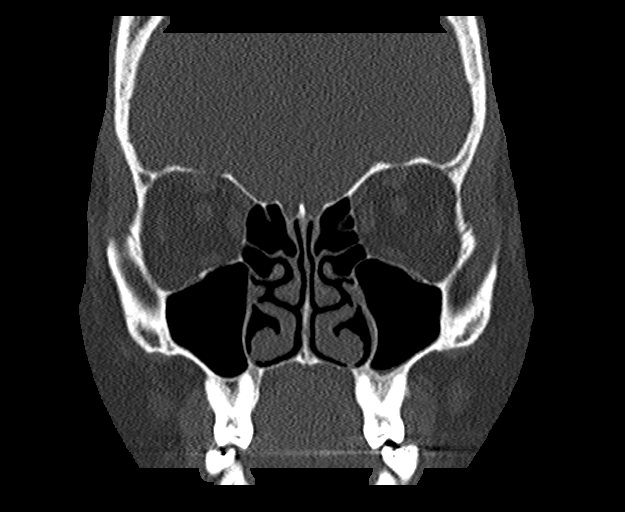
[im 47/78  bone]
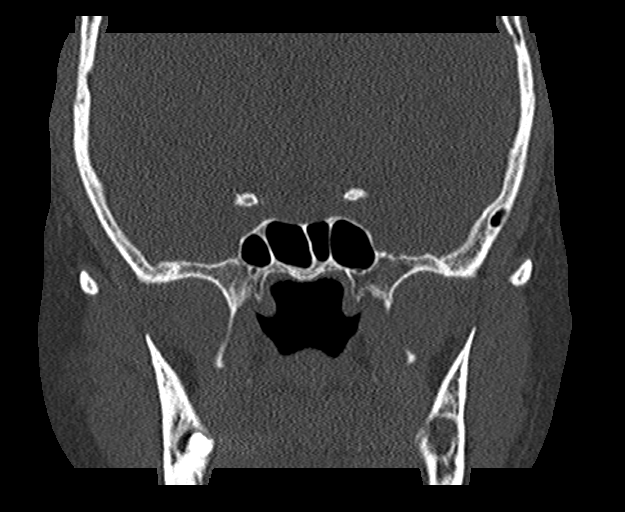

[Series 5: sagittal bone · sagittal · 0.26mm/px · 3 of 83 slices shown]
[im 35/83  bone]
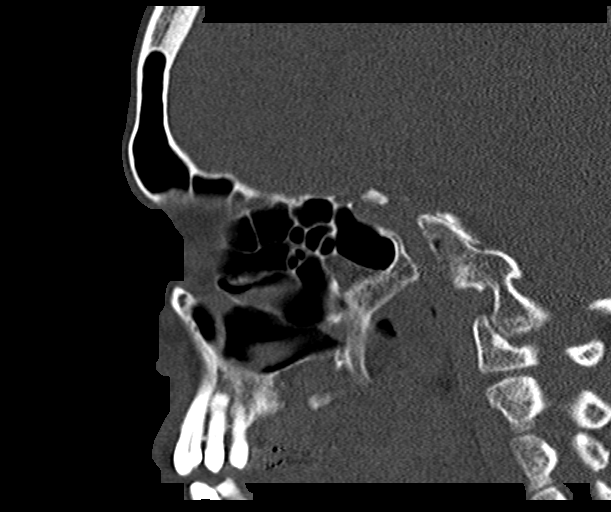
[im 42/83  bone]
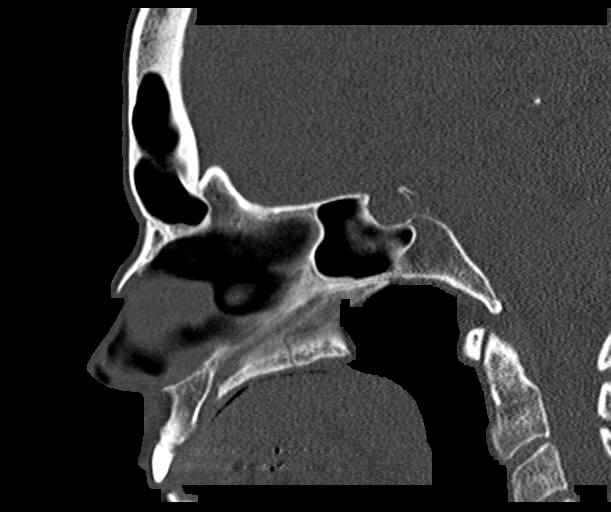
[im 48/83  bone]
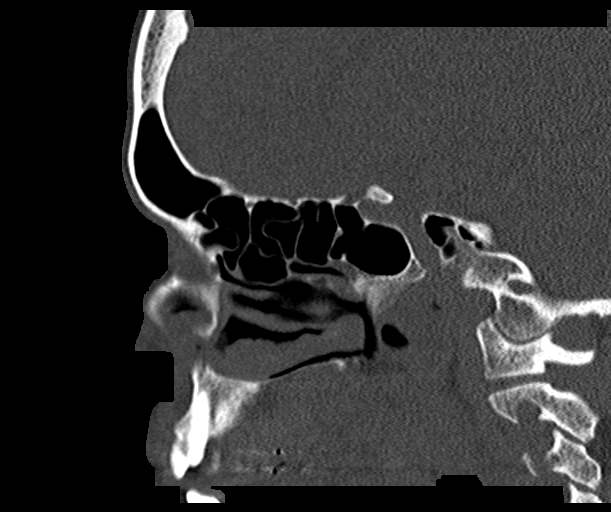

[15 of 33 positions shown; findings below may reference images not displayed]

FINDINGS: Osseous: The mandible is only partially visualized. Patient appears
to be status post recent removal of the left posterior molar or
wisdom tooth in the left mandible. No fracture or dislocation is
noted. No other osseous abnormality is noted.

Orbits: Negative. No traumatic or inflammatory finding.

Sinuses: Clear.

Soft tissues: Negative.

Limited intracranial: No significant or unexpected finding.
IMPRESSION: Status post recent surgical removal of left posterior molar or
wisdom tooth in the left mandible. Small amount a gas is noted
within this area which may represent postoperative finding, but
infection cannot be excluded. No other abnormality seen in the
maxillofacial region.

## 2018-06-24 ENCOUNTER — Encounter: Payer: Self-pay | Admitting: Family Medicine

## 2018-06-24 ENCOUNTER — Telehealth: Payer: Self-pay | Admitting: Neurology

## 2018-06-24 ENCOUNTER — Ambulatory Visit (INDEPENDENT_AMBULATORY_CARE_PROVIDER_SITE_OTHER): Payer: 59 | Admitting: Family Medicine

## 2018-06-24 VITALS — Wt 194.0 lb

## 2018-06-24 DIAGNOSIS — Z6835 Body mass index (BMI) 35.0-35.9, adult: Secondary | ICD-10-CM

## 2018-06-24 DIAGNOSIS — E7800 Pure hypercholesterolemia, unspecified: Secondary | ICD-10-CM

## 2018-06-24 DIAGNOSIS — R5383 Other fatigue: Secondary | ICD-10-CM | POA: Diagnosis not present

## 2018-06-24 DIAGNOSIS — R42 Dizziness and giddiness: Secondary | ICD-10-CM

## 2018-06-24 NOTE — Patient Instructions (Signed)
Thank you for coming in today. Please see Jade Friday.  Let us know if you have problems.  If you need a question answered same day speak with Triage.   Get labs tomorrow morning.  The lab opens at 730.   Sign up for mychart.

## 2018-06-24 NOTE — Telephone Encounter (Signed)
Patient left vm stating she has had dizziness for 2 weeks. She had leaned over two weeks ago and felt the room spinning, she thought it might be her glasses and removed them but it didn't help. She has had episodes of lightheadedness that come and go since. Appt made to discuss with Jade in office.

## 2018-06-24 NOTE — Progress Notes (Addendum)
Virtual Visit  via Video Note  I connected with      Olivia Miller  by a video enabled telemedicine application and verified that I am speaking with the correct person using two identifiers.   I discussed the limitations of evaluation and management by telemedicine and the availability of in person appointments. The patient expressed understanding and agreed to proceed.  History of Present Illness: Olivia Miller is a 58 y.o. female who would like to discuss dizziness.   Olivia Miller developed room spinning sensation about 2 weeks ago.  This lasted about 60 seconds or less.  She notes it resolved spontaneously.  She thinks that it may have been triggered by motion moving her head to the side or forward.  She notes its not recurred since.   Pretty well until this morning when she felt lightheaded.  She notes that she did not have any room spinning sensation and her symptoms lasted for about an hour.  She felt better after she ate a full meal.  She is feeling completely better now but is worried that with the room spinning dizziness 2 weeks ago and this lightheadedness today may be something more wrong.  She is not changing her medications are other lifestyle.  She feels pretty well otherwise with no fevers or chills.  She notes that she has a follow-up appointment scheduled in person with her primary care provider in 2 days.   Observations/Objective: Wt 194 lb (88 kg)   LMP 05/21/2015 (Within Days)   BMI 34.37 kg/m  Wt Readings from Last 5 Encounters:  06/24/18 194 lb (88 kg)  02/16/18 194 lb (88 kg)  02/04/18 201 lb 3.2 oz (91.3 kg)  08/13/17 194 lb (88 kg)  03/10/17 202 lb (91.6 kg)   Exam: Appearance Normal Speech.    Lab and Radiology Results No results found for this or any previous visit (from the past 72 hour(s)). No results found.   Assessment and Plan: 58 y.o. female with dizziness.  Dizziness 2 weeks ago sounds more consistent with BPPV.  Today's lightheadedness is  likely not the same and may not be related at all.  Her symptoms are more lightheadedness.  I agree with in person assessment in 2 days.  Reviewed precautions.  We will go ahead and get basic labs listed below to follow-up this issue as well as her several of her chronic medical problems tomorrow so that labs are back and available when she sees her primary care provider on Friday.  Precautions reviewed.  PDMP not reviewed this encounter. Orders Placed This Encounter  Procedures  . CBC  . Lipid Panel w/reflex Direct LDL  . COMPLETE METABOLIC PANEL WITH GFR  . Hemoglobin A1c  . TSH   No orders of the defined types were placed in this encounter.   Follow Up Instructions:    I discussed the assessment and treatment plan with the patient. The patient was provided an opportunity to ask questions and all were answered. The patient agreed with the plan and demonstrated an understanding of the instructions.   The patient was advised to call back or seek an in-person evaluation if the symptoms worsen or if the condition fails to improve as anticipated.  I provided 25 minutes of non-face-to-face time during this encounter.    Historical information moved to improve visibility of documentation.  Past Medical History:  Diagnosis Date  . Hyperlipidemia    No past surgical history on file. Social History   Tobacco Use  .  Smoking status: Never Smoker  . Smokeless tobacco: Never Used  Substance Use Topics  . Alcohol use: No    Alcohol/week: 0.0 standard drinks   family history includes Bone cancer in her father; Cancer in her father; Colon cancer in her maternal aunt; Stroke in her father.  Medications: Current Outpatient Medications  Medication Sig Dispense Refill  . Cholecalciferol (VITAMIN D-3) 5000 units TABS Take by mouth.    . diclofenac sodium (VOLTAREN) 1 % GEL Apply 4 g topically 4 (four) times daily. To affected joint. 100 g 11  . magnesium 30 MG tablet Take 30 mg by mouth  2 (two) times daily.    . Multiple Vitamins-Minerals (WOMENS MULTIVITAMIN PO) Take by mouth.    [name] omeprazole (PRILOSEC) 40 MG capsule Take 1 capsule (40 mg total) by mouth daily. 30 capsule 3  . Probiotic Product (PROBIOTIC PO) Take by mouth.     No current facility-administered medications for this visit.    No Known Allergies Addendum to correct date error due to templating issue

## 2018-06-26 ENCOUNTER — Ambulatory Visit (INDEPENDENT_AMBULATORY_CARE_PROVIDER_SITE_OTHER): Payer: 59 | Admitting: Physician Assistant

## 2018-06-26 ENCOUNTER — Encounter: Payer: Self-pay | Admitting: Physician Assistant

## 2018-06-26 VITALS — Temp 98.6°F | Ht 63.0 in | Wt 197.0 lb

## 2018-06-26 DIAGNOSIS — E782 Mixed hyperlipidemia: Secondary | ICD-10-CM | POA: Diagnosis not present

## 2018-06-26 DIAGNOSIS — R7303 Prediabetes: Secondary | ICD-10-CM | POA: Diagnosis not present

## 2018-06-26 DIAGNOSIS — Z6835 Body mass index (BMI) 35.0-35.9, adult: Secondary | ICD-10-CM

## 2018-06-26 DIAGNOSIS — J0141 Acute recurrent pansinusitis: Secondary | ICD-10-CM | POA: Diagnosis not present

## 2018-06-26 DIAGNOSIS — R05 Cough: Secondary | ICD-10-CM

## 2018-06-26 DIAGNOSIS — R059 Cough, unspecified: Secondary | ICD-10-CM

## 2018-06-26 DIAGNOSIS — H8113 Benign paroxysmal vertigo, bilateral: Secondary | ICD-10-CM

## 2018-06-26 LAB — HEMOGLOBIN A1C
Hgb A1c MFr Bld: 6.2 % of total Hgb — ABNORMAL HIGH (ref <5.7)
Mean Plasma Glucose: 131 (calc)
eAG (mmol/L): 7.3 (calc)

## 2018-06-26 LAB — COMPLETE METABOLIC PANEL WITH GFR
AG Ratio: 1.4 (calc) (ref 1.0–2.5)
ALT: 15 U/L (ref 6–29)
AST: 23 U/L (ref 10–35)
Albumin: 4 g/dL (ref 3.6–5.1)
Alkaline phosphatase (APISO): 75 U/L (ref 37–153)
BUN: 18 mg/dL (ref 7–25)
CO2: 29 mmol/L (ref 20–32)
Calcium: 9.3 mg/dL (ref 8.6–10.4)
Chloride: 105 mmol/L (ref 98–110)
Creat: 0.95 mg/dL (ref 0.50–1.05)
GFR, Est African American: 77 mL/min/{1.73_m2} (ref >=60)
GFR, Est Non African American: 66 mL/min/{1.73_m2} (ref >=60)
Globulin: 2.9 g/dL (calc) (ref 1.9–3.7)
Glucose, Bld: 96 mg/dL (ref 65–99)
Potassium: 4.3 mmol/L (ref 3.5–5.3)
Sodium: 139 mmol/L (ref 135–146)
Total Bilirubin: 0.3 mg/dL (ref 0.2–1.2)
Total Protein: 6.9 g/dL (ref 6.1–8.1)

## 2018-06-26 LAB — CBC
HCT: 40.7 % (ref 35.0–45.0)
Hemoglobin: 13.6 g/dL (ref 11.7–15.5)
MCH: 30.4 pg (ref 27.0–33.0)
MCHC: 33.4 g/dL (ref 32.0–36.0)
MCV: 91.1 fL (ref 80.0–100.0)
MPV: 9.8 fL (ref 7.5–12.5)
Platelets: 276 10*3/uL (ref 140–400)
RBC: 4.47 10*6/uL (ref 3.80–5.10)
RDW: 13.2 % (ref 11.0–15.0)
WBC: 4.9 10*3/uL (ref 3.8–10.8)

## 2018-06-26 LAB — LIPID PANEL W/REFLEX DIRECT LDL
Cholesterol: 252 mg/dL — ABNORMAL HIGH (ref <200)
HDL: 46 mg/dL — ABNORMAL LOW (ref >=50)
LDL Cholesterol (Calc): 182 mg/dL (calc) — ABNORMAL HIGH
Non-HDL Cholesterol (Calc): 206 mg/dL (calc) — ABNORMAL HIGH (ref <130)
Total CHOL/HDL Ratio: 5.5 (calc) — ABNORMAL HIGH (ref <5.0)
Triglycerides: 111 mg/dL (ref <150)

## 2018-06-26 LAB — TSH: TSH: 4.45 mIU/L (ref 0.40–4.50)

## 2018-06-26 MED ORDER — ALBUTEROL SULFATE HFA 108 (90 BASE) MCG/ACT IN AERS: 2.0000 | INHALATION_SPRAY | Freq: Four times a day (QID) | RESPIRATORY_TRACT | 0 refills | Status: DC | PRN

## 2018-06-26 MED ORDER — AMOXICILLIN-POT CLAVULANATE 875-125 MG PO TABS: 1.0000 | ORAL_TABLET | Freq: Two times a day (BID) | ORAL | 0 refills | Status: DC

## 2018-06-26 MED ORDER — ATORVASTATIN CALCIUM 20 MG PO TABS: 20.0000 mg | ORAL_TABLET | Freq: Every day | ORAL | 5 refills | Status: DC

## 2018-06-26 MED ORDER — METFORMIN HCL 500 MG PO TABS: 500.0000 mg | ORAL_TABLET | Freq: Two times a day (BID) | ORAL | 2 refills | Status: DC

## 2018-06-26 MED ORDER — MECLIZINE HCL 25 MG PO TABS: 25.0000 mg | ORAL_TABLET | Freq: Three times a day (TID) | ORAL | 0 refills | Status: DC | PRN

## 2018-06-26 NOTE — Patient Instructions (Addendum)
augmentin is for sinus infection and cough. TAKE Now.   antivert is to use as needed for dizziness.  Metformin is to take to keep sugars low and help with weight loss. Start after finish augmentin.   lipitor is for cholesterol and to start after finished augmentin and 2 weeks after metformin.   For cough can use albuterol inhaler as needed and consider taking acid reducer regularly if cough persist past taking augmentin. For that would take omeprazole daily.

## 2018-06-26 NOTE — Progress Notes (Signed)
Subjective:    Patient ID: Olivia Miller, female    DOB: 1961/01/16, 58 y.o.   MRN: [id number]  HPI  Pt is a 58 yo female who presents to the clinic with 2 weeks of dizziness. When she stands or changes position makes worse. She admits to feeling "like the room is spinning". She is having some sinus pressure and headaches. She also has some ear pressure and popping.  She does have a dry cough. No SOB or fever. She does have some sinus drainage. She was seen 2 days ago by Dr. Georgina Snell. He suspected BPPV. He ordered labs. Glucose and cholesterol were both elevated. No injury.   .. Active Ambulatory Problems    Diagnosis Date Noted  . Hyperlipidemia 03/03/2015  . Tachycardia 03/03/2015  . Left cervical radiculopathy 03/03/2015  . OAB (overactive bladder) 03/03/2015  . DDD (degenerative disc disease), cervical 03/03/2015  . Calcification of right carotid artery 06/07/2015  . Snoring 04/02/2016  . Apneic episode 04/02/2016  . Non-restorative sleep 04/02/2016  . Clostridium difficile colitis 04/18/2016  . Breast mass, right 04/23/2016  . Cervical polyp 08/06/2016  . Tinea pedis of left foot 08/06/2016  . Fibromyalgia 03/10/2017  . Nasal congestion 03/10/2017  . Palpitations 03/10/2017  . Esophageal dysphagia 03/10/2017  . Class 2 severe obesity due to excess calories with serious comorbidity and body mass index (BMI) of 35.0 to 35.9 in adult ([place]) 03/12/2017  . Family history of bone cancer 03/12/2017  . Chronic bilateral thoracic back pain 03/12/2017  . Chronic bilateral low back pain without sciatica 03/12/2017  . Female cystocele 02/04/2018  . Rectocele 02/04/2018  . Chronic pain of both knees 02/04/2018  . Primary osteoarthritis of knee 02/04/2018  . Plantar fasciitis of right foot 02/16/2018   Resolved Ambulatory Problems    Diagnosis Date Noted  . No Resolved Ambulatory Problems   No Additional Past Medical History     Review of Systems See HPI>     Objective:   Physical Exam Vitals signs reviewed.  Constitutional:      Appearance: Normal appearance.  HENT:     Head: Normocephalic.     Right Ear: Tympanic membrane normal.     Left Ear: Tympanic membrane normal.     Nose: Congestion present.     Mouth/Throat:     Mouth: Mucous membranes are moist.     Pharynx: Oropharynx is clear.  Cardiovascular:     Rate and Rhythm: Normal rate and regular rhythm.  Pulmonary:     Effort: Pulmonary effort is normal.     Breath sounds: Normal breath sounds.  Lymphadenopathy:     Cervical: No cervical adenopathy.  Neurological:     General: No focal deficit present.     Mental Status: She is alert and oriented to person, place, and time.     Comments: Negative dix hallpike.  Psychiatric:        Mood and Affect: Mood normal.           Assessment & Plan:  Marland KitchenMarland KitchenLivia was seen today for dizziness.  Diagnoses and all orders for this visit:  Acute recurrent pansinusitis -     amoxicillin-clavulanate (AUGMENTIN) 875-125 MG tablet; Take 1 tablet by mouth 2 (two) times daily.  Pre-diabetes -     metFORMIN (GLUCOPHAGE) 500 MG tablet; Take 1 tablet (500 mg total) by mouth 2 (two) times daily with a meal.  Class 2 severe obesity due to excess calories with serious comorbidity and body mass index (BMI)  of 35.0 to 35.9 in adult [facility]) -     metFORMIN (GLUCOPHAGE) 500 MG tablet; Take 1 tablet (500 mg total) by mouth 2 (two) times daily with a meal.  Mixed hyperlipidemia -     atorvastatin (LIPITOR) 20 MG tablet; Take 1 tablet (20 mg total) by mouth daily.  Benign paroxysmal positional vertigo due to bilateral vestibular disorder -     amoxicillin-clavulanate (AUGMENTIN) 875-125 MG tablet; Take 1 tablet by mouth 2 (two) times daily. -     meclizine (ANTIVERT) 25 MG tablet; Take 1 tablet (25 mg total) by mouth 3 (three) times daily as needed for dizziness.  Cough -     albuterol (VENTOLIN HFA) 108 (90 Base) MCG/ACT inhaler; Inhale 2 puffs into the lungs  every 6 (six) hours as needed for wheezing or shortness of breath.   Orthostatic BP were normal. See chart...   I suspect patient has a sinus infection causing BPPV. Given augmentin for sinus infection. Pt declines flonase as she hates nasal spray. antivert given for as needed dizziness. Given HO for epley manuevers do these 3 times a day with 3 reps. She could benefit from steroid but will avoid for now.   Labs showed pre-diabetes. Started metformin. Discussed weight loss strategies. Metformin could help with weight loss. Stay active at least 150 minutes of exercise a week.   Discussed elevated LDL. Pt agrees to start lipitor. Discussed side effects. Recheck in 4-6 months.   Albuterol given for cough as needed. Hx of some reactive airway. No wheezing heard on lungs today. If not improving consider omeprazole daily.   [name].Spent 30 minutes with patient and greater than 50 percent of visit spent counseling patient regarding treatment plan.

## 2018-06-26 NOTE — Progress Notes (Signed)
Thanks! Started on lipitor and metformin!

## 2018-06-29 ENCOUNTER — Encounter: Payer: Self-pay | Admitting: Physician Assistant

## 2018-06-29 ENCOUNTER — Telehealth: Payer: Self-pay | Admitting: Physician Assistant

## 2018-06-29 NOTE — Telephone Encounter (Signed)
Called patient and she states that dizziness spells have decreased but still having them occasionally. She has started antibiotics and feels better. She has not had to take Meclizine.  [place].

## 2018-06-29 NOTE — Telephone Encounter (Signed)
Can we please call patient and see how dizziness is?

## 2018-07-15 ENCOUNTER — Encounter: Payer: Self-pay | Admitting: Physician Assistant

## 2018-07-15 ENCOUNTER — Ambulatory Visit (INDEPENDENT_AMBULATORY_CARE_PROVIDER_SITE_OTHER): Payer: 59 | Admitting: Physician Assistant

## 2018-07-15 VITALS — BP 128/94 | HR 86 | Temp 98.3°F | Ht 63.0 in | Wt 198.0 lb

## 2018-07-15 DIAGNOSIS — L240 Irritant contact dermatitis due to detergents: Secondary | ICD-10-CM | POA: Diagnosis not present

## 2018-07-15 DIAGNOSIS — E559 Vitamin D deficiency, unspecified: Secondary | ICD-10-CM | POA: Diagnosis not present

## 2018-07-15 DIAGNOSIS — B351 Tinea unguium: Secondary | ICD-10-CM

## 2018-07-15 MED ORDER — CICLOPIROX 8 % EX SOLN: Freq: Every day | CUTANEOUS | 0 refills | Status: DC

## 2018-07-15 MED ORDER — HYDROXYZINE HCL 25 MG PO TABS: 25.0000 mg | ORAL_TABLET | Freq: Three times a day (TID) | ORAL | 0 refills | Status: DC | PRN

## 2018-07-15 MED ORDER — ERGOCALCIFEROL 1.25 MG (50000 UT) PO CAPS: 50000.0000 [IU] | ORAL_CAPSULE | ORAL | 0 refills | Status: DC

## 2018-07-15 NOTE — Patient Instructions (Signed)
Fungal Nail Infection A fungal nail infection is a common infection of the toenails or fingernails. This condition affects toenails more often than fingernails. It often affects the great, or big, toes. More than one nail may be infected. The condition can be passed from person to person (is contagious). What are the causes? This condition is caused by a fungus. Several types of fungi can cause the infection. These fungi are common in moist and warm areas. If your hands or feet come into contact with the fungus, it may get into a crack in your fingernail or toenail and cause the infection. What increases the risk? The following factors may make you more likely to develop this condition:  Being female.  Being of older age.  Living with someone who has the fungus.  Walking barefoot in areas where the fungus thrives, such as showers or locker rooms.  Wearing shoes and socks that cause your feet to sweat.  Having a nail injury or a recent nail surgery.  Having certain medical conditions, such as: ? Athlete's foot. ? Diabetes. ? Psoriasis. ? Poor circulation. ? A weak body defense system (immune system). What are the signs or symptoms? Symptoms of this condition include:  A pale spot on the nail.  Thickening of the nail.  A nail that becomes yellow or brown.  A brittle or ragged nail edge.  A crumbling nail.  A nail that has lifted away from the nail bed. How is this diagnosed? This condition is diagnosed with a physical exam. Your health care provider may take a scraping or clipping from your nail to test for the fungus. How is this treated? Treatment is not needed for mild infections. If you have significant nail changes, treatment may include:  Antifungal medicines taken by mouth (orally). You may need to take the medicine for several weeks or several months, and you may not see the results for a long time. These medicines can cause side effects. Ask your health care provider  what problems to watch for.  Antifungal nail polish or nail cream. These may be used along with oral antifungal medicines.  Laser treatment of the nail.  Surgery to remove the nail. This may be needed for the most severe infections. It can take a long time, usually up to a year, for the infection to go away. The infection may also come back. Follow these instructions at home: Medicines  Take or apply over-the-counter and prescription medicines only as told by your health care provider.  Ask your health care provider about using over-the-counter mentholated ointment on your nails. Nail care  Trim your nails often.  Wash and dry your hands and feet every day.  Keep your feet dry: ? Wear absorbent socks, and change your socks frequently. ? Wear shoes that allow air to circulate, such as sandals or canvas tennis shoes. Throw out old shoes.  Do not use artificial nails.  If you go to a nail salon, make sure you choose one that uses clean instruments.  Use antifungal foot powder on your feet and in your shoes. General instructions  Do not share personal items, such as towels or nail clippers.  Do not walk barefoot in shower rooms or locker rooms.  Wear rubber gloves if you are working with your hands in wet areas.  Keep all follow-up visits as told by your health care provider. This is important. Contact a health care provider if: Your infection is not getting better or it is getting worse   after several months. Summary  A fungal nail infection is a common infection of the toenails or fingernails.  Treatment is not needed for mild infections. If you have significant nail changes, treatment may include taking medicine orally and applying medicine to your nails.  It can take a long time, usually up to a year, for the infection to go away. The infection may also come back.  Take or apply over-the-counter and prescription medicines only as told by your health care provider.   Follow instructions for taking care of your nails to help prevent infection from coming back or spreading. This information is not intended to replace advice given to you by your health care provider. Make sure you discuss any questions you have with your health care provider. Document Released: 02/23/2000 Document Revised: 08/01/2017 Document Reviewed: 08/01/2017 Elsevier Interactive Patient Education  2019 [place] Dermatitis Dermatitis is redness, soreness, and swelling (inflammation) of the skin. Contact dermatitis is a reaction to something that touches the skin. There are two types of contact dermatitis:  Irritant contact dermatitis. This happens when something bothers (irritates) your skin, like soap.  Allergic contact dermatitis. This is caused when you are exposed to something that you are allergic to, such as poison ivy. What are the causes?  Common causes of irritant contact dermatitis include: ? Makeup. ? Soaps. ? Detergents. ? Bleaches. ? Acids. ? Metals, such as nickel.  Common causes of allergic contact dermatitis include: ? Plants. ? Chemicals. ? Jewelry. ? Latex. ? Medicines. ? Preservatives in products, such as clothing. What increases the risk?  Having a job that exposes you to things that bother your skin.  Having asthma or eczema. What are the signs or symptoms? Symptoms may happen anywhere the irritant has touched your skin. Symptoms include:  Dry or flaky skin.  Redness.  Cracks.  Itching.  Pain or a burning feeling.  Blisters.  Blood or clear fluid draining from skin cracks. With allergic contact dermatitis, swelling may occur. This may happen in places such as the eyelids, mouth, or genitals. How is this treated?  This condition is treated by checking for the cause of the reaction and protecting your skin. Treatment may also include: ? Steroid creams, ointments, or medicines. ? Antibiotic medicines or other ointments, if you  have a skin infection. ? Lotion or medicines to help with itching. ? A bandage (dressing). Follow these instructions at home: Skin care  Moisturize your skin as needed.  Put cool cloths on your skin.  Put a baking soda paste on your skin. Stir water into baking soda until it looks like a paste.  Do not scratch your skin.  Avoid having things rub up against your skin.  Avoid the use of soaps, perfumes, and dyes. Medicines  Take or apply over-the-counter and prescription medicines only as told by your doctor.  If you were prescribed an antibiotic medicine, take or apply it as told by your doctor. Do not stop using it even if your condition starts to get better. Bathing  Take a bath with: ? Epsom salts. ? Baking soda. ? Colloidal oatmeal.  Bathe less often.  Bathe in warm water. Avoid using hot water. Bandage care  If you were given a bandage, change it as told by your health care provider.  Wash your hands with soap and water before and after you change your bandage. If soap and water are not available, use hand sanitizer. General instructions  Avoid the things that caused your reaction.  If you do not know what caused it, keep a journal. Write down: ? What you eat. ? What skin products you use. ? What you drink. ? What you wear in the area that has symptoms. This includes jewelry.  Check the affected areas every day for signs of infection. Check for: ? More redness, swelling, or pain. ? More fluid or blood. ? Warmth. ? Pus or a bad smell.  Keep all follow-up visits as told by your doctor. This is important. Contact a doctor if:  You do not get better with treatment.  Your condition gets worse.  You have signs of infection, such as: ? More swelling. ? Tenderness. ? More redness. ? Soreness. ? Warmth.  You have a fever.  You have new symptoms. Get help right away if:  You have a very bad headache.  You have neck pain.  Your neck is stiff.  You  throw up (vomit).  You feel very sleepy.  You see red streaks coming from the area.  Your bone or joint near the area hurts after the skin has healed.  The area turns darker.  You have trouble breathing. Summary  Dermatitis is redness, soreness, and swelling of the skin.  Symptoms may occur where the irritant has touched you.  Treatment may include medicines and skin care.  If you do not know what caused your reaction, keep a journal.  Contact a doctor if your condition gets worse or you have signs of infection. This information is not intended to replace advice given to you by your health care provider. Make sure you discuss any questions you have with your health care provider. Document Released: 12/23/2008 Document Revised: 09/10/2017 Document Reviewed: 09/10/2017 Elsevier Interactive Patient Education  2019 [employer].

## 2018-07-15 NOTE — Progress Notes (Signed)
Subjective:    Patient ID: Olivia Miller, female    DOB: 04/22/1960, 58 y.o.   MRN: [id number]  HPI  Pt is a 58 yo female who presents to the clinic with rash over trunk, arms, legs for 1 week. Rash is itchy. She has not taken anything OTC. She has been using hand sanizer over itchy spots. She does admit she started using detergent from [employer] right before this started. She denies any SOB, dysphagia, tongue or lip swelling.   Having trouble getting her vitamin D. Wants rx.   She has a thick yellow toenail she would like looked at. No pain.    .. Active Ambulatory Problems    Diagnosis Date Noted  . Hyperlipidemia 03/03/2015  . Tachycardia 03/03/2015  . Left cervical radiculopathy 03/03/2015  . OAB (overactive bladder) 03/03/2015  . DDD (degenerative disc disease), cervical 03/03/2015  . Calcification of right carotid artery 06/07/2015  . Snoring 04/02/2016  . Apneic episode 04/02/2016  . Non-restorative sleep 04/02/2016  . Clostridium difficile colitis 04/18/2016  . Breast mass, right 04/23/2016  . Cervical polyp 08/06/2016  . Tinea pedis of left foot 08/06/2016  . Fibromyalgia 03/10/2017  . Nasal congestion 03/10/2017  . Palpitations 03/10/2017  . Esophageal dysphagia 03/10/2017  . Class 2 severe obesity due to excess calories with serious comorbidity and body mass index (BMI) of 35.0 to 35.9 in adult ([place]) 03/12/2017  . Family history of bone cancer 03/12/2017  . Chronic bilateral thoracic back pain 03/12/2017  . Chronic bilateral low back pain without sciatica 03/12/2017  . Female cystocele 02/04/2018  . Rectocele 02/04/2018  . Chronic pain of both knees 02/04/2018  . Primary osteoarthritis of knee 02/04/2018  . Plantar fasciitis of right foot 02/16/2018  . Irritant contact dermatitis due to detergent 07/15/2018   Resolved Ambulatory Problems    Diagnosis Date Noted  . No Resolved Ambulatory Problems   No Additional Past Medical History      Review  of Systems  All other systems reviewed and are negative.      Objective:   Physical Exam Vitals signs reviewed.  Constitutional:      Appearance: Normal appearance.  HENT:     Head: Normocephalic and atraumatic.     Mouth/Throat:     Mouth: Mucous membranes are moist.     Pharynx: No oropharyngeal exudate or posterior oropharyngeal erythema.  Cardiovascular:     Rate and Rhythm: Normal rate and regular rhythm.  Pulmonary:     Effort: Pulmonary effort is normal.  Skin:    Comments: scattered diffuse maculopapular rash over trunk, arms, legs. With some excoriations from scratching.   Left great toe dystrophic nail with thick yellow appearance mostly at the tip.   Neurological:     General: No focal deficit present.     Mental Status: She is alert and oriented to person, place, and time.  Psychiatric:        Mood and Affect: Mood normal.           Assessment & Plan:  Marland KitchenMarland KitchenKhaila was seen today for rash.  Diagnoses and all orders for this visit:  Irritant contact dermatitis due to detergent -     hydrOXYzine (ATARAX/VISTARIL) 25 MG tablet; Take 1 tablet (25 mg total) by mouth 3 (three) times daily as needed.  Vitamin D insufficiency -     ergocalciferol (VITAMIN D2) 1.25 MG (50000 UT) capsule; Take 1 capsule (50,000 Units total) by mouth once a week.  Onychomycosis -  ciclopirox (PENLAC) 8 % solution; Apply topically at bedtime. Apply over nail and surrounding skin. Apply daily over previous coat. After seven (7) days, may remove with alcohol and continue cycle.  likely new detergent caused rash. Stop using detergent, vistaril given for itchiness. Discussed prednisone. Will hold and see if rash continues to improve. Recent cmp check with great liver and kidney. No new medications.   Refilled vitamin D until she can get back into stores to buy D3.   Treated for toenail fungus. Discussed prevention.

## 2018-09-11 ENCOUNTER — Other Ambulatory Visit: Payer: Self-pay | Admitting: Physician Assistant

## 2018-09-11 DIAGNOSIS — E559 Vitamin D deficiency, unspecified: Secondary | ICD-10-CM

## 2018-10-19 ENCOUNTER — Ambulatory Visit (INDEPENDENT_AMBULATORY_CARE_PROVIDER_SITE_OTHER): Payer: 59 | Admitting: Obstetrics & Gynecology

## 2018-10-19 ENCOUNTER — Encounter: Payer: Self-pay | Admitting: Obstetrics & Gynecology

## 2018-10-19 ENCOUNTER — Other Ambulatory Visit: Payer: Self-pay

## 2018-10-19 VITALS — BP 155/97 | HR 84 | Ht 62.0 in | Wt 201.0 lb

## 2018-10-19 DIAGNOSIS — N841 Polyp of cervix uteri: Secondary | ICD-10-CM

## 2018-10-19 DIAGNOSIS — N950 Postmenopausal bleeding: Secondary | ICD-10-CM

## 2018-10-19 DIAGNOSIS — Z01419 Encounter for gynecological examination (general) (routine) without abnormal findings: Secondary | ICD-10-CM | POA: Diagnosis not present

## 2018-10-19 DIAGNOSIS — Z1151 Encounter for screening for human papillomavirus (HPV): Secondary | ICD-10-CM

## 2018-10-19 DIAGNOSIS — Z124 Encounter for screening for malignant neoplasm of cervix: Secondary | ICD-10-CM

## 2018-10-19 MED ORDER — MISOPROSTOL 200 MCG PO TABS: ORAL_TABLET | ORAL | 0 refills | Status: DC

## 2018-10-19 NOTE — Progress Notes (Signed)
Pt c/o spotting for 30 days Last pap- 08/13/17- negative

## 2018-10-19 NOTE — Progress Notes (Signed)
Subjective:    Olivia Miller is a 58 y.o. married P4 (81 1/2 grands) female who presents for an annual exam. The patient has no complaints today. She has had spotting for a month. The patient is sexually active. GYN screening history: last pap: was normal. The patient wears seatbelts: yes. The patient participates in regular exercise: yes. (eliptical and zoom aerobics) Has the patient ever been transfused or tattooed?: no. The patient reports that there is not domestic violence in her life.   Menstrual History: OB History    Gravida  4   Para  4   Term  4   Preterm      AB      Living  4     SAB      TAB      Ectopic      Multiple      Live Births              Menarche age: 17 Patient's last menstrual period was 05/21/2015 (within days).    The following portions of the patient's history were reviewed and updated as appropriate: allergies, current medications, past family history, past medical history, past social history, past surgical history and problem list.  Review of Systems Pertinent items are noted in HPI.   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   Objective:    BP (!) 155/97   Pulse 84   Ht 5\' 2"  (1.575 m)   Wt 201 lb (91.2 kg)   LMP 05/21/2015 (Within Days)   BMI 36.76 kg/m   General Appearance:    Alert, cooperative, no distress, appears stated age  Head:    Normocephalic, without obvious abnormality, atraumatic  Eyes:    PERRL, conjunctiva/corneas clear, EOM's intact, fundi    benign, both eyes  Ears:    Normal TM's and external ear canals, both ears  Nose:   Nares normal, septum midline, mucosa normal, no drainage    or sinus tenderness  Throat:   Lips, mucosa, and tongue normal; teeth and gums normal  Neck:   Supple, symmetrical, trachea midline, no adenopathy;    thyroid:  no enlargement/tenderness/nodules; no carotid   bruit or JVD  Back:     Symmetric, no curvature,  ROM normal, no CVA tenderness  Lungs:     Clear to auscultation bilaterally, respirations unlabored  Chest Wall:    No tenderness or deformity   Heart:    Regular rate and rhythm, S1 and S2 normal, no murmur, rub   or gallop  Breast Exam:    No tenderness, masses, or nipple abnormality  Abdomen:     Soft, non-tender, bowel sounds active all four quadrants,    no masses, no organomegaly  Genitalia:    Normal female without lesion, discharge or tenderness, cervical polyp seen and easily removed with a ring forceps     Extremities:   Extremities normal, atraumatic, no cyanosis or edema  Pulses:   2+ and symmetric all extremities  Skin:   Skin color, texture, turgor normal, no rashes or lesions  Lymph nodes:   Cervical, supraclavicular, and axillary nodes normal  Neurologic:   CNII-XII intact, normal strength, sensation and reflexes    throughout  .    Assessment:    Healthy female exam.   PMB Cervical polyp   Plan:     Thin prep Pap smear. with cotesting  PMB- gyn u/s, pretreat with  cytotec and EMBX if endometrium is 5 mm or more cerivcal polyp removed and sent to pathology Mammogram to be scheduled

## 2018-10-20 LAB — CYTOLOGY - PAP
Diagnosis: NEGATIVE
HPV: NOT DETECTED

## 2019-03-25 ENCOUNTER — Ambulatory Visit (INDEPENDENT_AMBULATORY_CARE_PROVIDER_SITE_OTHER): Payer: 59 | Admitting: Physician Assistant

## 2019-03-25 ENCOUNTER — Encounter: Payer: Self-pay | Admitting: Physician Assistant

## 2019-03-25 VITALS — Ht 62.0 in | Wt 201.0 lb

## 2019-03-25 DIAGNOSIS — R05 Cough: Secondary | ICD-10-CM | POA: Diagnosis not present

## 2019-03-25 DIAGNOSIS — R059 Cough, unspecified: Secondary | ICD-10-CM | POA: Insufficient documentation

## 2019-03-25 DIAGNOSIS — K219 Gastro-esophageal reflux disease without esophagitis: Secondary | ICD-10-CM | POA: Insufficient documentation

## 2019-03-25 DIAGNOSIS — R0602 Shortness of breath: Secondary | ICD-10-CM

## 2019-03-25 DIAGNOSIS — J310 Chronic rhinitis: Secondary | ICD-10-CM | POA: Diagnosis not present

## 2019-03-25 DIAGNOSIS — R062 Wheezing: Secondary | ICD-10-CM | POA: Diagnosis not present

## 2019-03-25 DIAGNOSIS — R053 Chronic cough: Secondary | ICD-10-CM

## 2019-03-25 MED ORDER — OMEPRAZOLE 40 MG PO CPDR: 40.0000 mg | DELAYED_RELEASE_CAPSULE | Freq: Every day | ORAL | 1 refills | Status: DC

## 2019-03-25 MED ORDER — PREDNISONE 50 MG PO TABS: ORAL_TABLET | ORAL | 0 refills | Status: DC

## 2019-03-25 MED ORDER — LORATADINE 10 MG PO TABS: 10.0000 mg | ORAL_TABLET | Freq: Every day | ORAL | 11 refills | Status: DC

## 2019-03-25 NOTE — Progress Notes (Addendum)
Patient ID: Olivia Miller, female   DOB: 30-Jul-1960, 59 y.o.   MRN: [id number] .Marland KitchenVirtual Visit via Video Note  I connected with Olivia Miller on 03/25/19 at  8:10 AM EST by a video enabled telemedicine application and verified that I am speaking with the correct person using two identifiers.  Location: Patient: car Provider: clinic   I discussed the limitations of evaluation and management by telemedicine and the availability of in person appointments. The patient expressed understanding and agreed to proceed.  History of Present Illness: Patient is a 59 year old female with a history of GERD, chronic rhinitis and recurrent sinus infections who presents to the clinic with persistent cough for the last 2 to 3 months.  Patient denies any fever, chills, body aches, sore throat.  She does admit to times where she feels like she is wheezing.  She does admit to some shortness of breath with exertion.  Her cough seems mostly dry.  Seems to be worse at night and she is having many nights where she cannot sleep.  She has ongoing chronic rhinitis.  She does not tolerate nasal sprays.  She is not taking any daily allergy medication.  She has noticed her acid reflux is worsening.  She is not taking anything for this.  She does admit she has been on some type of medication in the past.  She also reports that one time this happened and she was given a short dose of something that really helped for many months.  .. Active Ambulatory Problems    Diagnosis Date Noted  . Hyperlipidemia 03/03/2015  . Tachycardia 03/03/2015  . Left cervical radiculopathy 03/03/2015  . OAB (overactive bladder) 03/03/2015  . DDD (degenerative disc disease), cervical 03/03/2015  . Calcification of right carotid artery 06/07/2015  . Snoring 04/02/2016  . Apneic episode 04/02/2016  . Non-restorative sleep 04/02/2016  . Clostridium difficile colitis 04/18/2016  . Breast mass, right 04/23/2016  . Cervical polyp 08/06/2016  .  Tinea pedis of left foot 08/06/2016  . Fibromyalgia 03/10/2017  . Nasal congestion 03/10/2017  . Palpitations 03/10/2017  . Esophageal dysphagia 03/10/2017  . Class 2 severe obesity due to excess calories with serious comorbidity and body mass index (BMI) of 35.0 to 35.9 in adult ([place]) 03/12/2017  . Family history of bone cancer 03/12/2017  . Chronic bilateral thoracic back pain 03/12/2017  . Chronic bilateral low back pain without sciatica 03/12/2017  . Female cystocele 02/04/2018  . Rectocele 02/04/2018  . Chronic pain of both knees 02/04/2018  . Primary osteoarthritis of knee 02/04/2018  . Plantar fasciitis of right foot 02/16/2018  . Irritant contact dermatitis due to detergent 07/15/2018  . Onychomycosis 07/15/2018  . Vitamin D insufficiency 07/15/2018  . Cough, persistent 03/25/2019  . Gastroesophageal reflux disease 03/25/2019  . Wheezing 03/25/2019  . Chronic rhinitis 03/25/2019   Resolved Ambulatory Problems    Diagnosis Date Noted  . No Resolved Ambulatory Problems   No Additional Past Medical History   Reviewed med, allergy, problem list.    Observations/Objective: No acute distress.  Normal mood and appearance. Dry cough noted on video.  .. Today's Vitals   03/25/19 0803  Weight: 201 lb (91.2 kg)  Height: 5\' 2"  (1.575 m)   Body mass index is 36.76 kg/m.    Assessment and Plan: Marland KitchenMarland KitchenAntonia was seen today for cough.  Diagnoses and all orders for this visit:  Cough, persistent -     predniSONE (DELTASONE) 50 MG tablet; One tab PO daily for 5 days. -  loratadine (CLARITIN) 10 MG tablet; Take 1 tablet (10 mg total) by mouth daily.  Gastroesophageal reflux disease, unspecified whether esophagitis present -     omeprazole (PRILOSEC) 40 MG capsule; Take 1 capsule (40 mg total) by mouth daily.  Wheezing -     predniSONE (DELTASONE) 50 MG tablet; One tab PO daily for 5 days.  Chronic rhinitis -     predniSONE (DELTASONE) 50 MG tablet; One tab PO daily  for 5 days. -     loratadine (CLARITIN) 10 MG tablet; Take 1 tablet (10 mg total) by mouth daily.  SOB (shortness of breath) -     predniSONE (DELTASONE) 50 MG tablet; One tab PO daily for 5 days.   Patient does present like a cough that could be caused by inflammation or GERD.  She does endorse some wheezing and shortness of breath.  I will give her a burst of prednisone for 5 days.  I would like for her to start omeprazole 40 mg in the morning for her GERD and Claritin 10 mg daily for her chronic rhinitis and cough.  Patient does not tolerate nasal sprays.  In the next 3 weeks we could certainly do spirometry to rule out asthma.  Follow-up in 1 month to see how symptoms are improving.  If cough is worsening or changing please call office.  Spent 28 minutes with patient and in chart review.     Follow Up Instructions:    I discussed the assessment and treatment plan with the patient. The patient was provided an opportunity to ask questions and all were answered. The patient agreed with the plan and demonstrated an understanding of the instructions.   The patient was advised to call back or seek an in-person evaluation if the symptoms worsen or if the condition fails to improve as anticipated.     Iran Planas, PA-C

## 2019-03-25 NOTE — Progress Notes (Signed)
Coughing and wheezing back - going on last 3 months, problem comes and goes, has been given a prescription in the past that helps   Having some bad heartburn  [id number] completed. (0)

## 2019-03-25 NOTE — Patient Instructions (Signed)

## 2019-04-02 ENCOUNTER — Emergency Department (INDEPENDENT_AMBULATORY_CARE_PROVIDER_SITE_OTHER)
Admission: EM | Admit: 2019-04-02 | Discharge: 2019-04-02 | Disposition: A | Discharge status: Home / Self Care | Payer: 59 | Source: Home / Self Care

## 2019-04-02 ENCOUNTER — Other Ambulatory Visit: Payer: Self-pay | Admitting: Physician Assistant

## 2019-04-02 ENCOUNTER — Other Ambulatory Visit: Payer: Self-pay

## 2019-04-02 ENCOUNTER — Telehealth: Payer: Self-pay | Admitting: Neurology

## 2019-04-02 ENCOUNTER — Ambulatory Visit (INDEPENDENT_AMBULATORY_CARE_PROVIDER_SITE_OTHER): Payer: 59 | Admitting: Physician Assistant

## 2019-04-02 ENCOUNTER — Emergency Department (INDEPENDENT_AMBULATORY_CARE_PROVIDER_SITE_OTHER): Payer: 59

## 2019-04-02 VITALS — BP 154/101 | HR 86 | Temp 98.0°F | Ht 62.0 in | Wt 200.0 lb

## 2019-04-02 DIAGNOSIS — R7303 Prediabetes: Secondary | ICD-10-CM

## 2019-04-02 DIAGNOSIS — R05 Cough: Secondary | ICD-10-CM

## 2019-04-02 DIAGNOSIS — R079 Chest pain, unspecified: Secondary | ICD-10-CM | POA: Diagnosis not present

## 2019-04-02 DIAGNOSIS — E782 Mixed hyperlipidemia: Secondary | ICD-10-CM

## 2019-04-02 DIAGNOSIS — R059 Cough, unspecified: Secondary | ICD-10-CM

## 2019-04-02 DIAGNOSIS — R030 Elevated blood-pressure reading, without diagnosis of hypertension: Secondary | ICD-10-CM

## 2019-04-02 DIAGNOSIS — M542 Cervicalgia: Secondary | ICD-10-CM

## 2019-04-02 DIAGNOSIS — R053 Chronic cough: Secondary | ICD-10-CM

## 2019-04-02 DIAGNOSIS — K219 Gastro-esophageal reflux disease without esophagitis: Secondary | ICD-10-CM

## 2019-04-02 DIAGNOSIS — J310 Chronic rhinitis: Secondary | ICD-10-CM

## 2019-04-02 MED ORDER — OMEPRAZOLE 40 MG PO CPDR: 40.0000 mg | DELAYED_RELEASE_CAPSULE | Freq: Two times a day (BID) | ORAL | 2 refills | Status: DC

## 2019-04-02 NOTE — ED Triage Notes (Signed)
Pt c/o intermittent chest and back pain x 2 mos. Worsening, becoming shooting pain in last few days. Some pain in RT arm and jaw pain. Pain 9/10 Mentions feeling like she has to burp at times. Recently rx'd omeprazole but did not take this morning.

## 2019-04-02 NOTE — Discharge Instructions (Addendum)
See Olivia Miller for recheck.  See the Cardiologist for evaluation.  Start taking a baby aspirin every day

## 2019-04-02 NOTE — Progress Notes (Signed)
Ongoing cough, just finished prednisone

## 2019-04-02 NOTE — Telephone Encounter (Signed)
Ok I can talk you her but if she is having CP/heart symptoms I agree with UC.

## 2019-04-02 NOTE — Telephone Encounter (Signed)
Patient left vm stating she is done with prednisone and has ongoing cough. She wanted to speak with you about this and some new symptoms. I made her a telephone visit this afternoon and called her. She states she is heading to urgent care now for signs of a heart attack. She states back pain, pain/numbness in right arm, and pain in jaw. Encouraged her to head on to Urgent care for evaluation, but she did want to keep phone visit this afternoon - [place]. Please instruct if she needs to do something different.

## 2019-04-02 NOTE — Progress Notes (Addendum)
Patient ID: Olivia Miller, female   DOB: 1960/12/14, 59 y.o.   MRN: [id number] .Marland KitchenVirtual Visit via Video Note  I connected with [name] on 04/02/2019 at  1:40 PM EST by a video enabled telemedicine application and verified that I am speaking with the correct person using two identifiers.  Location: Patient: home Provider: clinic   I discussed the limitations of evaluation and management by telemedicine and the availability of in person appointments. The patient expressed understanding and agreed to proceed.  History of Present Illness: Pt is a 59 yo female with chronic rhinitis who calls in today to discuss ongoing cough. Cough is better but not gone after last visit when she was given prednisone, omeprazole, claritin. She still hears some wheezing. No dx of asthma.   She went to UC earlier today with upper back and pain pain that is constant and worsening. Some radiation down right arm into thumb.  EKG no acute changes. CXR no acute visible left hemidiaphragm. Suspect more GI issues but recommended cardiology consult.   She admits she is not taking lipitor. Last LDL was 182.  .. Active Ambulatory Problems    Diagnosis Date Noted  . Hyperlipidemia 03/03/2015  . Tachycardia 03/03/2015  . Left cervical radiculopathy 03/03/2015  . OAB (overactive bladder) 03/03/2015  . DDD (degenerative disc disease), cervical 03/03/2015  . Calcification of right carotid artery 06/07/2015  . Snoring 04/02/2016  . Apneic episode 04/02/2016  . Non-restorative sleep 04/02/2016  . Clostridium difficile colitis 04/18/2016  . Breast mass, right 04/23/2016  . Cervical polyp 08/06/2016  . Tinea pedis of left foot 08/06/2016  . Fibromyalgia 03/10/2017  . Nasal congestion 03/10/2017  . Palpitations 03/10/2017  . Esophageal dysphagia 03/10/2017  . Class 2 severe obesity due to excess calories with serious comorbidity and body mass index (BMI) of 35.0 to 35.9 in adult ([place]) 03/12/2017  . Family history  of bone cancer 03/12/2017  . Chronic bilateral thoracic back pain 03/12/2017  . Chronic bilateral low back pain without sciatica 03/12/2017  . Female cystocele 02/04/2018  . Rectocele 02/04/2018  . Chronic pain of both knees 02/04/2018  . Primary osteoarthritis of knee 02/04/2018  . Plantar fasciitis of right foot 02/16/2018  . Irritant contact dermatitis due to detergent 07/15/2018  . Onychomycosis 07/15/2018  . Vitamin D insufficiency 07/15/2018  . Cough 03/25/2019  . Gastroesophageal reflux disease 03/25/2019  . Wheezing 03/25/2019  . Chronic rhinitis 03/25/2019  . Neck pain 04/05/2019  . Elevated blood pressure reading 04/05/2019  . Pre-diabetes 04/05/2019   Resolved Ambulatory Problems    Diagnosis Date Noted  . No Resolved Ambulatory Problems   No Additional Past Medical History   Reviewed med, allergy, problem list.     Observations/Objective: No acute distress.  Normal breathing.  Normal mood.   .. Today's Vitals   04/02/19 1028  BP: (!) 154/101  Pulse: 86  Temp: 98 F (36.7 C)  TempSrc: Oral  Weight: 200 lb (90.7 kg)  Height: 5\' 2"  (1.575 m)   Body mass index is 36.58 kg/m.    Assessment and Plan: Marland KitchenMarland KitchenKia was seen today for cough.  Diagnoses and all orders for this visit:  Persistent cough -     omeprazole (PRILOSEC) 40 MG capsule; Take 1 capsule (40 mg total) by mouth 2 (two) times daily.  Gastroesophageal reflux disease, unspecified whether esophagitis present -     omeprazole (PRILOSEC) 40 MG capsule; Take 1 capsule (40 mg total) by mouth 2 (two) times daily.  Mixed  hyperlipidemia -     Lipid Panel w/reflex Direct LDL  Pre-diabetes -     COMPLETE METABOLIC PANEL WITH GFR -     Hemoglobin A1c  Chronic rhinitis  Elevated blood pressure reading  Neck pain   Increase omeprazole 40mg  twice a day for GERD. Continue claritin.  Come in to schedule spirometry for asthma evaluation.  Will make cardiology evaluation. She certainly has  some cardivascular risk factors.   Discussed elevated BP and elevated LDL. Strongly recommended keeping watch on bP if staying that elevated need to start medication. She is not taking lipitor. Strongly encouraged lipitor for CV risk reduction.   Neck pain could be some radiculitis. Massage/icy hot/tens unit. Need follow up in office.   Follow up in 2 weeks.   Spent 28 minutes with patient.   Follow Up Instructions:    I discussed the assessment and treatment plan with the patient. The patient was provided an opportunity to ask questions and all were answered. The patient agreed with the plan and demonstrated an understanding of the instructions.   The patient was advised to call back or seek an in-person evaluation if the symptoms worsen or if the condition fails to improve as anticipated.   Iran Planas, PA-C

## 2019-04-02 NOTE — ED Provider Notes (Signed)
[facility]    CSN: [id number] Arrival date & time: 04/02/19  1119      History   Chief Complaint Chief Complaint  Patient presents with  . Chest Pain  . Neck Pain  . Back Pain    HPI Olivia Miller is a 59 y.o. female.   The history is provided by the patient. No language interpreter was used.  Chest Pain Pain quality: aching   Pain radiates to:  Upper back and neck Pain severity:  Moderate Onset quality:  Gradual Timing:  Constant Progression:  Worsening Chronicity:  New Relieved by:  Nothing Worsened by:  Nothing Ineffective treatments:  None tried Associated symptoms: back pain and shortness of breath   Associated symptoms: no abdominal pain   Neck Pain Associated symptoms: chest pain   Back Pain Associated symptoms: chest pain   Associated symptoms: no abdominal pain   Pt reports some increased fatique with exertion.  She reports pain in her neck and upper back.  Pt states pain improves with belching.  Pt recently started on omeprazole no current pain  Past Medical History:  Diagnosis Date  . Hyperlipidemia     Patient Active Problem List   Diagnosis Date Noted  . Cough, persistent 03/25/2019  . Gastroesophageal reflux disease 03/25/2019  . Wheezing 03/25/2019  . Chronic rhinitis 03/25/2019  . Irritant contact dermatitis due to detergent 07/15/2018  . Onychomycosis 07/15/2018  . Vitamin D insufficiency 07/15/2018  . Plantar fasciitis of right foot 02/16/2018  . Female cystocele 02/04/2018  . Rectocele 02/04/2018  . Chronic pain of both knees 02/04/2018  . Primary osteoarthritis of knee 02/04/2018  . Class 2 severe obesity due to excess calories with serious comorbidity and body mass index (BMI) of 35.0 to 35.9 in adult ([place]) 03/12/2017  . Family history of bone cancer 03/12/2017  . Chronic bilateral thoracic back pain 03/12/2017  . Chronic bilateral low back pain without sciatica 03/12/2017  . Fibromyalgia 03/10/2017  . Nasal  congestion 03/10/2017  . Palpitations 03/10/2017  . Esophageal dysphagia 03/10/2017  . Cervical polyp 08/06/2016  . Tinea pedis of left foot 08/06/2016  . Breast mass, right 04/23/2016  . Clostridium difficile colitis 04/18/2016  . Snoring 04/02/2016  . Apneic episode 04/02/2016  . Non-restorative sleep 04/02/2016  . Calcification of right carotid artery 06/07/2015  . Hyperlipidemia 03/03/2015  . Tachycardia 03/03/2015  . Left cervical radiculopathy 03/03/2015  . OAB (overactive bladder) 03/03/2015  . DDD (degenerative disc disease), cervical 03/03/2015    No past surgical history on file.  OB History    Gravida  4   Para  4   Term  4   Preterm      AB      Living  4     SAB      TAB      Ectopic      Multiple      Live Births               Home Medications    Prior to Admission medications   Medication Sig Start Date End Date Taking? Authorizing Provider  Cholecalciferol (VITAMIN D-3) 5000 units TABS Take by mouth.    [provider]  loratadine (CLARITIN) 10 MG tablet Take 1 tablet (10 mg total) by mouth daily. 03/25/19   Breeback, Jade L, PA-C  Omega-3 Fatty Acids (FISH OIL) 1000 MG CAPS Take by mouth.    [provider]  omeprazole (PRILOSEC) 40 MG capsule Take 1  capsule (40 mg total) by mouth daily. 03/25/19   Breeback, Jade L, PA-C  predniSONE (DELTASONE) 50 MG tablet One tab PO daily for 5 days. Patient not taking: Reported on 04/02/2019 03/25/19   Donella Stade, PA-C  Probiotic Product (PROBIOTIC PO) Take by mouth.    [provider]    Family History Family History  Problem Relation Age of Onset  . Cancer Father   . Stroke Father   . Bone cancer Father   . Colon cancer Maternal Aunt     Social History Social History   Tobacco Use  . Smoking status: Never Smoker  . Smokeless tobacco: Never Used  Substance Use Topics  . Alcohol use: No    Alcohol/week: 0.0 standard drinks  . Drug use: No      Allergies   Patient has no known allergies.   Review of Systems Review of Systems  Respiratory: Positive for shortness of breath.   Cardiovascular: Positive for chest pain.  Gastrointestinal: Negative for abdominal pain.  Musculoskeletal: Positive for back pain and neck pain.  All other systems reviewed and are negative.    Physical Exam Triage Vital Signs ED Triage Vitals  Enc Vitals Group     BP 04/02/19 1128 (!) 169/104     Pulse Rate 04/02/19 1128 86     Resp 04/02/19 1128 18     Temp 04/02/19 1128 98 F (36.7 C)     Temp Source 04/02/19 1128 Oral     SpO2 04/02/19 1128 98 %     Weight 04/02/19 1129 200 lb 9.9 oz (91 kg)     Height 04/02/19 1129 5\' 2"  (1.575 m)     Head Circumference --      Peak Flow --      Pain Score 04/02/19 1129 0     Pain Loc --      Pain Edu? --      Excl. in [place]? --    No data found.  Updated Vital Signs BP (!) 154/101 (BP Location: Left Arm)   Pulse 86   Temp 98 F (36.7 C) (Oral)   Resp 18   Ht 5\' 2"  (1.575 m)   Wt 91 kg   LMP 05/21/2015 (Within Days)   SpO2 98%   BMI 36.69 kg/m   Visual Acuity Right Eye Distance:   Left Eye Distance:   Bilateral Distance:    Right Eye Near:   Left Eye Near:    Bilateral Near:     Physical Exam Vitals and nursing note reviewed.  Constitutional:      Appearance: She is well-developed.  HENT:     Head: Normocephalic.  Cardiovascular:     Rate and Rhythm: Normal rate and regular rhythm.     Heart sounds: Normal heart sounds.  Pulmonary:     Effort: Pulmonary effort is normal.     Breath sounds: Normal breath sounds.  Abdominal:     General: There is no distension.  Musculoskeletal:        General: Normal range of motion.     Cervical back: Normal range of motion.  Skin:    General: Skin is warm.  Neurological:     General: No focal deficit present.     Mental Status: She is alert and oriented to person, place, and time.  Psychiatric:        Mood and Affect: Mood normal.       UC Treatments / Results  Labs (all labs ordered  are listed, but only abnormal results are displayed) Labs Reviewed - No data to display  EKG   Radiology DG Chest 2 View  Result Date: 04/02/2019 CLINICAL DATA:  Chest pain EXAM: CHEST - 2 VIEW COMPARISON:  None. FINDINGS: Lungs are clear. Heart size and pulmonary vascularity are normal. No adenopathy. No bone lesions. No pneumothorax. There is slight elevation of the left hemidiaphragm. IMPRESSION: Mild elevation of left hemidiaphragm. Lungs clear. Cardiac silhouette normal. No adenopathy. Electronically Signed   By: Lowella Grip III M.D.   On: 04/02/2019 12:15    Procedures Procedures (including critical care time)  Medications Ordered in UC Medications - No data to display  Initial Impression / Assessment and Plan / UC Course  I have reviewed the triage vital signs and the nursing notes.  Pertinent labs & imaging results that were available during my care of the patient were reviewed by me and considered in my medical decision making (see chart for details).     EKG no acute abnormality normal ekg.  Chest xray no acute I counseled pt.   Pt referred to cardiology for evalatuion.  I doubt mi or acute issue however given risk I think cardiology evaluation would be appropriate.  Pt has symptoms that sound like reflux, possible hiatal hernia.  Pt is advised to follow up with Iran Planas for recheck next week.  Final Clinical Impressions(s) / UC Diagnoses   Final diagnoses:  Neck pain  Chest pain, unspecified type     Discharge Instructions     See Luvenia Starch for recheck.  See the Cardiologist for evaluation.  Start taking a baby aspirin every day   ED Prescriptions    None     PDMP not reviewed this encounter.   Fransico Meadow, [place] 04/02/19 1344

## 2019-04-05 ENCOUNTER — Encounter: Payer: Self-pay | Admitting: Physician Assistant

## 2019-04-05 DIAGNOSIS — R7303 Prediabetes: Secondary | ICD-10-CM | POA: Insufficient documentation

## 2019-04-05 DIAGNOSIS — M542 Cervicalgia: Secondary | ICD-10-CM | POA: Insufficient documentation

## 2019-04-05 DIAGNOSIS — R030 Elevated blood-pressure reading, without diagnosis of hypertension: Secondary | ICD-10-CM | POA: Insufficient documentation

## 2019-04-07 ENCOUNTER — Other Ambulatory Visit: Payer: Self-pay

## 2019-04-07 ENCOUNTER — Ambulatory Visit (INDEPENDENT_AMBULATORY_CARE_PROVIDER_SITE_OTHER): Payer: 59 | Admitting: Cardiology

## 2019-04-07 ENCOUNTER — Encounter: Payer: Self-pay | Admitting: Cardiology

## 2019-04-07 VITALS — BP 156/110 | HR 85 | Ht 62.0 in | Wt 200.0 lb

## 2019-04-07 DIAGNOSIS — K219 Gastro-esophageal reflux disease without esophagitis: Secondary | ICD-10-CM | POA: Diagnosis not present

## 2019-04-07 DIAGNOSIS — R030 Elevated blood-pressure reading, without diagnosis of hypertension: Secondary | ICD-10-CM

## 2019-04-07 DIAGNOSIS — R079 Chest pain, unspecified: Secondary | ICD-10-CM | POA: Diagnosis not present

## 2019-04-07 DIAGNOSIS — R0789 Other chest pain: Secondary | ICD-10-CM | POA: Diagnosis not present

## 2019-04-07 DIAGNOSIS — I6521 Occlusion and stenosis of right carotid artery: Secondary | ICD-10-CM

## 2019-04-07 DIAGNOSIS — M5412 Radiculopathy, cervical region: Secondary | ICD-10-CM

## 2019-04-07 MED ORDER — ATORVASTATIN CALCIUM 20 MG PO TABS: 20.0000 mg | ORAL_TABLET | Freq: Every day | ORAL | 1 refills | Status: DC

## 2019-04-07 MED ORDER — ASPIRIN EC 81 MG PO TBEC: 81.0000 mg | DELAYED_RELEASE_TABLET | Freq: Every day | ORAL | 1 refills | Status: DC

## 2019-04-07 NOTE — Progress Notes (Signed)
Cardiology Consultation:    Date:  04/07/2019   ID:  [name], DOB 12/19/1960, MRN [id number]  PCP:  Donella Stade, PA-C  Cardiologist:  Jenne Campus, MD   Referring MD: Donella Stade, PA-C   Chief Complaint  Patient presents with  . New Patient (Initial Visit)  . Hyperlipidemia  . Hypertension    History of Present Illness:    Olivia Miller is a 59 y.o. female who is being seen today for the evaluation of chest pain at the request of Alden Hipp, Jade L, PA-C.  With past medical history significant for borderline diabetes, recently recognized hypertension, calcification of her coronary arteries.  She comes today to my office to be established as a patient.  She complained of having some chest pain.  Pain is usually sharp stabbing short lasting not related to exercise also she got some longer lasting pain sometimes pain goes to her back.  Pain in the back is usually worse by moving or twisting her body.  She also concern about having some pain in the right jaw.  She read in the Internet that he could be sign of heart trouble and she is very much concerned about it.  At that sensation can last all day.  She is trying to be active but admits that she does not exercise on a regular basis.  She does not have any exertional chest pain, tightness, pressure, burning in the chest that would indicate typical angina pectoris.  However, she described fatigue and tiredness while walking.  Past Medical History:  Diagnosis Date  . Hyperlipidemia     History reviewed. No pertinent surgical history.  Current Medications: Current Meds  Medication Sig  . albuterol (VENTOLIN HFA) 108 (90 Base) MCG/ACT inhaler Inhale 2 puffs into the lungs every 6 (six) hours as needed for wheezing or shortness of breath. APPT FOR FURTHER REFILLS  . Cholecalciferol (VITAMIN D-3) 5000 units TABS Take by mouth.  . loratadine (CLARITIN) 10 MG tablet Take 1 tablet (10 mg total) by mouth daily.  . Omega-3  Fatty Acids (FISH OIL) 1000 MG CAPS Take by mouth.  [name] omeprazole (PRILOSEC) 40 MG capsule Take 1 capsule (40 mg total) by mouth 2 (two) times daily.  . Probiotic Product (PROBIOTIC PO) Take by mouth.     Allergies:   Patient has no known allergies.   Social History   Socioeconomic History  . Marital status: Married    Spouse name: Not on file  . Number of children: Not on file  . Years of education: Not on file  . Highest education level: Not on file  Occupational History  . Not on file  Tobacco Use  . Smoking status: Never Smoker  . Smokeless tobacco: Never Used  Substance and Sexual Activity  . Alcohol use: No    Alcohol/week: 0.0 standard drinks  . Drug use: No  . Sexual activity: Yes    Birth control/protection: Post-menopausal  Other Topics Concern  . Not on file  Social History Narrative  . Not on file   Social Determinants of Health   Financial Resource Strain:   . Difficulty of Paying Living Expenses: Not on file  Food Insecurity:   . Worried About Charity fundraiser in the Last Year: Not on file  . Ran Out of Food in the Last Year: Not on file  Transportation Needs:   . Lack of Transportation (Medical): Not on file  . Lack of Transportation (Non-Medical): Not on file  Physical Activity:   . Days of Exercise per Week: Not on file  . Minutes of Exercise per Session: Not on file  Stress:   . Feeling of Stress : Not on file  Social Connections:   . Frequency of Communication with Friends and Family: Not on file  . Frequency of Social Gatherings with Friends and Family: Not on file  . Attends Religious Services: Not on file  . Active Member of Clubs or Organizations: Not on file  . Attends Archivist Meetings: Not on file  . Marital Status: Not on file     Family History: The patient's family history includes Bone cancer in her father; Cancer in her father; Colon cancer in her maternal aunt; Stroke in her father. ROS:   Please see the history  of present illness.    All 14 point review of systems negative except as described per history of present illness.  EKGs/Labs/Other Studies Reviewed:    The following studies were reviewed today I did review her record from the emergency room where she was on 22 January.  It was unrevealing  EKG done at that time showed no acute ischemia.  Recent Labs: 06/25/2018: ALT 15; BUN 18; Creat 0.95; Hemoglobin 13.6; Platelets 276; Potassium 4.3; Sodium 139; TSH 4.45  Recent Lipid Panel    Component Value Date/Time   CHOL 252 (H) 06/25/2018 0813   TRIG 111 06/25/2018 0813   HDL 46 (L) 06/25/2018 0813   CHOLHDL 5.5 (H) 06/25/2018 0813   VLDL 26 04/10/2016 1130   LDLCALC 182 (H) 06/25/2018 0813    Physical Exam:    VS:  BP (!) 156/110   Pulse 85   Ht 5\' 2"  (1.575 m)   Wt 200 lb (90.7 kg)   LMP 05/21/2015 (Within Days)   SpO2 99%   BMI 36.58 kg/m     Wt Readings from Last 3 Encounters:  04/07/19 200 lb (90.7 kg)  04/02/19 200 lb (90.7 kg)  04/02/19 200 lb 9.9 oz (91 kg)     GEN:  Well nourished, well developed in no acute distress HEENT: Normal NECK: No JVD; No carotid bruits LYMPHATICS: No lymphadenopathy CARDIAC: RRR, no murmurs, no rubs, no gallops RESPIRATORY:  Clear to auscultation without rales, wheezing or rhonchi  ABDOMEN: Soft, non-tender, non-distended MUSCULOSKELETAL:  No edema; No deformity  SKIN: Warm and dry NEUROLOGIC:  Alert and oriented x 3 PSYCHIATRIC:  Normal affect   ASSESSMENT:    1. Chest pain, unspecified type   2. Atypical chest pain   3. Calcification of right carotid artery   4. Gastroesophageal reflux disease, unspecified whether esophagitis present   5. Left cervical radiculopathy   6. Elevated blood pressure reading    PLAN:    In order of problems listed above:  1. Chest pain atypical but lately has multiple risk factors for coronary artery disease.  That include dyslipidemia with LDL of 180, hypertension that she is developing as well  as borderline diabetes.  We talked in length about the situation in spite of the fact that pain is atypical I think it would be reasonable to perform stress test on her.  I will ask her to start taking 1 baby aspirin every single day.  We will schedule her to have Lexiscan. 2. Calcification of right coronary artery.  We will repeat carotid ultrasounds next time when I see her. 3. Gastroesophageal reflux disease a portion of the symptoms that she describes is probably related to it.  She  said she got difficulty swallowing, food can stack and she need to drink some water to pass this food to her stomach.  I talked to her about potentially seeing GI specialist that will recommend 4. When I see her. 5. Essential hypertension I asked her to start checking her blood pressure on a regular basis and bring results to me on top of that I will do echocardiogram to reassess left ventricle ejection fraction more importantly look for evidence of left ventricle hypertrophy. 6. Dyslipidemia she agreed to start taking medication I will give her 20 mg Lipitor every single day.   Medication Adjustments/Labs and Tests Ordered: Current medicines are reviewed at length with the patient today.  Concerns regarding medicines are outlined above.  Orders Placed This Encounter  Procedures  . MYOCARDIAL PERFUSION IMAGING  . ECHOCARDIOGRAM COMPLETE   Meds ordered this encounter  Medications  . atorvastatin (LIPITOR) 20 MG tablet    Sig: Take 1 tablet (20 mg total) by mouth daily.    Dispense:  90 tablet    Refill:  1  . aspirin EC 81 MG tablet    Sig: Take 1 tablet (81 mg total) by mouth daily.    Dispense:  90 tablet    Refill:  1    Signed, Park Liter, MD, [facility]. 04/07/2019 9:52 AM    [place]

## 2019-04-07 NOTE — Patient Instructions (Signed)
Medication Instructions:  Your physician has recommended you make the following change in your medication:   START: Lipitor 20 mg daily  START: Aspirin 81 mg daily   *If you need a refill on your cardiac medications before your next appointment, please call your pharmacy*  Lab Work: None.  If you have labs (blood work) drawn today and your tests are completely normal, you will receive your results only by: [name] MyChart Message (if you have MyChart) OR . A paper copy in the mail If you have any lab test that is abnormal or we need to change your treatment, we will call you to review the results.  Testing/Procedures: Your physician has requested that you have an echocardiogram. Echocardiography is a painless test that uses sound waves to create images of your heart. It provides your doctor with information about the size and shape of your heart and how well your heart's chambers and valves are working. This procedure takes approximately one hour. There are no restrictions for this procedure.  Your physician has requested that you have a lexiscan myoview. For further information please visit [email or web address]. Please follow instruction sheet, as given.    Follow-Up: At [facility], you and your health needs are our priority.  As part of our continuing mission to provide you with exceptional heart care, we have created designated Provider Care Teams.  These Care Teams include your primary Cardiologist (physician) and Advanced Practice Providers (APPs -  Physician Assistants and Nurse Practitioners) who all work together to provide you with the care you need, when you need it.  Your next appointment:   1 month(s)  The format for your next appointment:   In Person  Provider:   You may see Dr. Agustin Cree or the following Advanced Practice Provider on your designated Care Team:    Laurann Montana, FNP   Other Instructions   Echocardiogram An echocardiogram is a procedure that uses  painless sound waves (ultrasound) to produce an image of the heart. Images from an echocardiogram can provide important information about:  Signs of coronary artery disease (CAD).  Aneurysm detection. An aneurysm is a weak or damaged part of an artery wall that bulges out from the normal force of blood pumping through the body.  Heart size and shape. Changes in the size or shape of the heart can be associated with certain conditions, including heart failure, aneurysm, and CAD.  Heart muscle function.  Heart valve function.  Signs of a past heart attack.  Fluid buildup around the heart.  Thickening of the heart muscle.  A tumor or infectious growth around the heart valves. Tell a health care provider about:  Any allergies you have.  All medicines you are taking, including vitamins, herbs, eye drops, creams, and over-the-counter medicines.  Any blood disorders you have.  Any surgeries you have had.  Any medical conditions you have.  Whether you are pregnant or may be pregnant. What are the risks? Generally, this is a safe procedure. However, problems may occur, including:  Allergic reaction to dye (contrast) that may be used during the procedure. What happens before the procedure? No specific preparation is needed. You may eat and drink normally. What happens during the procedure?   An IV tube may be inserted into one of your veins.  You may receive contrast through this tube. A contrast is an injection that improves the quality of the pictures from your heart.  A gel will be applied to your chest.  A wand-like tool (  transducer) will be moved over your chest. The gel will help to transmit the sound waves from the transducer.  The sound waves will harmlessly bounce off of your heart to allow the heart images to be captured in real-time motion. The images will be recorded on a computer. The procedure may vary among health care providers and hospitals. What happens after  the procedure?  You may return to your normal, everyday life, including diet, activities, and medicines, unless your health care provider tells you not to do that. Summary  An echocardiogram is a procedure that uses painless sound waves (ultrasound) to produce an image of the heart.  Images from an echocardiogram can provide important information about the size and shape of your heart, heart muscle function, heart valve function, and fluid buildup around your heart.  You do not need to do anything to prepare before this procedure. You may eat and drink normally.  After the echocardiogram is completed, you may return to your normal, everyday life, unless your health care provider tells you not to do that. This information is not intended to replace advice given to you by your health care provider. Make sure you discuss any questions you have with your health care provider. Document Revised: 06/18/2018 Document Reviewed: 03/30/2016 Elsevier Patient Education  2020 [place].   Cardiac Nuclear Scan A cardiac nuclear scan is a test that measures blood flow to the heart when a person is resting and when he or she is exercising. The test looks for problems such as:  Not enough blood reaching a portion of the heart.  The heart muscle not working normally. You may need this test if:  You have heart disease.  You have had abnormal lab results.  You have had heart surgery or a balloon procedure to open up blocked arteries (angioplasty).  You have chest pain.  You have shortness of breath. In this test, a radioactive dye (tracer) is injected into your bloodstream. After the tracer has traveled to your heart, an imaging device is used to measure how much of the tracer is absorbed by or distributed to various areas of your heart. This procedure is usually done at a hospital and takes 2-4 hours. Tell a health care provider about:  Any allergies you have.  All medicines you are taking,  including vitamins, herbs, eye drops, creams, and over-the-counter medicines.  Any problems you or family members have had with anesthetic medicines.  Any blood disorders you have.  Any surgeries you have had.  Any medical conditions you have.  Whether you are pregnant or may be pregnant. What are the risks? Generally, this is a safe procedure. However, problems may occur, including:  Serious chest pain and heart attack. This is only a risk if the stress portion of the test is done.  Rapid heartbeat.  Sensation of warmth in your chest. This usually passes quickly.  Allergic reaction to the tracer. What happens before the procedure?  Ask your health care provider about changing or stopping your regular medicines. This is especially important if you are taking diabetes medicines or blood thinners.  Follow instructions from your health care provider about eating or drinking restrictions.  Remove your jewelry on the day of the procedure. What happens during the procedure?  An IV will be inserted into one of your veins.  Your health care provider will inject a small amount of radioactive tracer through the IV.  You will wait for 20-40 minutes while the tracer travels through  your bloodstream.  Your heart activity will be monitored with an electrocardiogram (ECG).  You will lie down on an exam table.  Images of your heart will be taken for about 15-20 minutes.  You may also have a stress test. For this test, one of the following may be done: ? You will exercise on a treadmill or stationary bike. While you exercise, your heart's activity will be monitored with an ECG, and your blood pressure will be checked. ? You will be given medicines that will increase blood flow to parts of your heart. This is done if you are unable to exercise.  When blood flow to your heart has peaked, a tracer will again be injected through the IV.  After 20-40 minutes, you will get back on the exam  table and have more images taken of your heart.  Depending on the type of tracer used, scans may need to be repeated 3-4 hours later.  Your IV line will be removed when the procedure is over. The procedure may vary among health care providers and hospitals. What happens after the procedure?  Unless your health care provider tells you otherwise, you may return to your normal schedule, including diet, activities, and medicines.  Unless your health care provider tells you otherwise, you may increase your fluid intake. This will help to flush the contrast dye from your body. Drink enough fluid to keep your urine pale yellow.  Ask your health care provider, or the department that is doing the test: ? When will my results be ready? ? How will I get my results? Summary  A cardiac nuclear scan measures the blood flow to the heart when a person is resting and when he or she is exercising.  Tell your health care provider if you are pregnant.  Before the procedure, ask your health care provider about changing or stopping your regular medicines. This is especially important if you are taking diabetes medicines or blood thinners.  After the procedure, unless your health care provider tells you otherwise, increase your fluid intake. This will help flush the contrast dye from your body.  After the procedure, unless your health care provider tells you otherwise, you may return to your normal schedule, including diet, activities, and medicines. This information is not intended to replace advice given to you by your health care provider. Make sure you discuss any questions you have with your health care provider. Document Revised: 08/11/2017 Document Reviewed: 08/11/2017 Elsevier Patient Education  [place].   Aspirin and Your Heart  Aspirin is a medicine that prevents the cells in the blood that are used for clotting, called platelets, from sticking together. Aspirin can be used to help  reduce the risk of blood clots, heart attacks, and other heart-related problems. Can I take aspirin? Your health care provider will help you determine whether it is safe and beneficial for you to take aspirin daily. Taking aspirin daily may be helpful if you:  Have had a heart attack or chest pain.  Are at risk for a heart attack.  Have undergone open-heart surgery, such as coronary artery bypass surgery (CABG).  Have had coronary angioplasty or a stent.  Have had certain types of stroke or transient ischemic attack (TIA).  Have peripheral artery disease (PAD).  Have chronic heart rhythm problems such as atrial fibrillation and cannot take an anticoagulant.  Have valve disease or have had surgery on a valve. What are the risks? Daily use of aspirin can cause side  effects. Some of these include:  Bleeding. Bleeding problems can be minor or serious. An example of a minor problem is a cut that does not stop bleeding. An example of a more serious problem is stomach bleeding or, rarely, bleeding into the brain. Your risk of bleeding is increased if you are also taking non-steroidal anti-inflammatory drugs (NSAIDs).  Increased bruising.  Upset stomach.  An allergic reaction. People who have nasal polyps have an increased risk of developing an aspirin allergy. General guidelines  Take aspirin only as told by your health care provider. Make sure that you understand how much you should take and what form you should take. The two forms of aspirin are: ? Non-enteric-coated.This type of aspirin does not have a coating and is absorbed quickly. This type of aspirin also comes in a chewable form. ? Enteric-coated. This type of aspirin has a coating that releases the medicine very slowly. Enteric-coated aspirin might cause less stomach upset than non-enteric-coated aspirin. This type of aspirin should not be chewed or crushed.  Limit alcohol intake to no more than 1 drink a day for nonpregnant  women and 2 drinks a day for men. Drinking alcohol increases your risk of bleeding. One drink equals 12 oz of beer, 5 oz of wine, or 1 oz of hard liquor. Contact a health care provider if you:  Have unusual bleeding or bruising.  Have stomach pain or nausea.  Have ringing in your ears.  Have an allergic reaction that causes: ? Hives. ? Itchy skin. ? Swelling of the lips, tongue, or face. Get help right away if you:  Notice that your bowel movements are bloody, dark red, or black in color.  Vomit or cough up blood.  Have blood in your urine.  Cough, have noisy breathing (wheeze), or feel short of breath.  Have chest pain, especially if the pain spreads to the arms, back, neck, or jaw.  Have a severe headache, or a headache with confusion, or dizziness. These symptoms may represent a serious problem that is an emergency. Do not wait to see if the symptoms will go away. Get medical help right away. Call your local emergency services (911 in the [place] Do not drive yourself to the hospital. Summary  Aspirin can be used to help reduce the risk of blood clots, heart attacks, and other heart-related problems.  Daily use of aspirin can increase your risk of side effects. Your health care provider will help you determine whether it is safe and beneficial for you to take aspirin daily.  Take aspirin only as told by your health care provider. Make sure that you understand how much you can take and what form you can take. This information is not intended to replace advice given to you by your health care provider. Make sure you discuss any questions you have with your health care provider. Document Revised: 12/26/2016 Document Reviewed: 12/26/2016 Elsevier Patient Education  [place].  Atorvastatin tablets What is this medicine? ATORVASTATIN (a TORE va sta tin) is known as a HMG-CoA reductase inhibitor or 'statin'. It lowers the level of cholesterol and triglycerides in the  blood. This drug may also reduce the risk of heart attack, stroke, or other health problems in patients with risk factors for heart disease. Diet and lifestyle changes are often used with this drug. This medicine may be used for other purposes; ask your health care provider or pharmacist if you have questions. COMMON BRAND NAME(S): Lipitor What should I tell my health  care provider before I take this medicine? They need to know if you have any of these conditions:  diabetes  if you often drink alcohol  history of stroke  kidney disease  liver disease  muscle aches or weakness  thyroid disease  an unusual or allergic reaction to atorvastatin, other medicines, foods, dyes, or preservatives  pregnant or trying to get pregnant  breast-feeding How should I use this medicine? Take this medicine by mouth with a glass of water. Follow the directions on the prescription label. You can take it with or without food. If it upsets your stomach, take it with food. Do not take with grapefruit juice. Take your medicine at regular intervals. Do not take it more often than directed. Do not stop taking except on your doctor's advice. Talk to your pediatrician regarding the use of this medicine in children. While this drug may be prescribed for children as young as 10 for selected conditions, precautions do apply. Overdosage: If you think you have taken too much of this medicine contact a poison control center or emergency room at once. NOTE: This medicine is only for you. Do not share this medicine with others. What if I miss a dose? If you miss a dose, take it as soon as you can. If your next dose is to be taken in less than 12 hours, then do not take the missed dose. Take the next dose at your regular time. Do not take double or extra doses. What may interact with this medicine? Do not take this medicine with any of the following medications:  dasabuvir; ombitasvir; paritaprevir;  ritonavir  ombitasvir; paritaprevir; ritonavir  posaconazole  red yeast rice This medicine may also interact with the following medications:  alcohol  birth control pills  certain antibiotics like erythromycin and clarithromycin  certain antivirals for HIV or hepatitis  certain medicines for cholesterol like fenofibrate, gemfibrozil, and niacin  certain medicines for fungal infections like ketoconazole and itraconazole  colchicine  cyclosporine  digoxin  grapefruit juice  rifampin This list may not describe all possible interactions. Give your health care provider a list of all the medicines, herbs, non-prescription drugs, or dietary supplements you use. Also tell them if you smoke, drink alcohol, or use illegal drugs. Some items may interact with your medicine. What should I watch for while using this medicine? Visit your doctor or health care professional for regular check-ups. You may need regular tests to make sure your liver is working properly. Your health care professional may tell you to stop taking this medicine if you develop muscle problems. If your muscle problems do not go away after stopping this medicine, contact your health care professional. Do not become pregnant while taking this medicine. Women should inform their health care professional if they wish to become pregnant or think they might be pregnant. There is a potential for serious side effects to an unborn child. Talk to your health care professional or pharmacist for more information. Do not breast-feed an infant while taking this medicine. This medicine may increase blood sugar. Ask your healthcare provider if changes in diet or medicines are needed if you have diabetes. If you are going to need surgery or other procedure, tell your doctor that you are using this medicine. This drug is only part of a total heart-health program. Your doctor or a dietician can suggest a low-cholesterol and low-fat diet to  help. Avoid alcohol and smoking, and keep a proper exercise schedule. This medicine may cause a  decrease in Co-Enzyme Q-10. You should make sure that you get enough Co-Enzyme Q-10 while you are taking this medicine. Discuss the foods you eat and the vitamins you take with your health care professional. What side effects may I notice from receiving this medicine? Side effects that you should report to your doctor or health care professional as soon as possible:  allergic reactions like skin rash, itching or hives, swelling of the face, lips, or tongue  fever  joint pain  loss of memory  redness, blistering, peeling or loosening of the skin, including inside the mouth  signs and symptoms of high blood sugar such as being more thirsty or hungry or having to urinate more than normal. You may also feel very tired or have blurry vision.  signs and symptoms of liver injury like dark yellow or brown urine; general ill feeling or flu-like symptoms; light-belly pain; unusually weak or tired; yellowing of the eyes or skin  signs and symptoms of muscle injury like dark urine; trouble passing urine or change in the amount of urine; unusually weak or tired; muscle pain or side or back pain Side effects that usually do not require medical attention (report to your doctor or health care professional if they continue or are bothersome):  diarrhea  nausea  stomach pain  trouble sleeping  upset stomach This list may not describe all possible side effects. Call your doctor for medical advice about side effects. You may report side effects to FDA at 1-800-FDA-1088. Where should I keep my medicine? Keep out of the reach of children. Store between 20 and 25 degrees C (68 and 77 degrees F). Throw away any unused medicine after the expiration date. NOTE: This sheet is a summary. It may not cover all possible information. If you have questions about this medicine, talk to your doctor, pharmacist, or health  care provider.  2020 Elsevier/Gold Standard (2017-12-17 11:36:16)

## 2019-04-09 ENCOUNTER — Ambulatory Visit (HOSPITAL_BASED_OUTPATIENT_CLINIC_OR_DEPARTMENT_OTHER): Payer: 59

## 2019-04-12 ENCOUNTER — Encounter (HOSPITAL_COMMUNITY): Payer: 59

## 2019-04-14 ENCOUNTER — Other Ambulatory Visit (HOSPITAL_BASED_OUTPATIENT_CLINIC_OR_DEPARTMENT_OTHER): Payer: 59

## 2019-04-21 ENCOUNTER — Telehealth (HOSPITAL_COMMUNITY): Payer: Self-pay

## 2019-04-21 ENCOUNTER — Other Ambulatory Visit: Payer: Self-pay

## 2019-04-21 ENCOUNTER — Ambulatory Visit (HOSPITAL_BASED_OUTPATIENT_CLINIC_OR_DEPARTMENT_OTHER)
Admission: RE | Admit: 2019-04-21 | Discharge: 2019-04-21 | Disposition: A | Discharge status: Home / Self Care | Payer: 59 | Source: Ambulatory Visit | Attending: Cardiology | Admitting: Cardiology

## 2019-04-21 ENCOUNTER — Telehealth (HOSPITAL_COMMUNITY): Payer: Self-pay | Admitting: *Deleted

## 2019-04-21 DIAGNOSIS — R079 Chest pain, unspecified: Secondary | ICD-10-CM | POA: Diagnosis present

## 2019-04-21 NOTE — Progress Notes (Signed)
  Echocardiogram 2D Echocardiogram has been performed.  Cardell Peach 04/21/2019, 8:54 AM

## 2019-04-21 NOTE — Telephone Encounter (Signed)
Olivia Miller called with some concerns about the nuclear stress test. We discussed the process and how things would transpire. She stated that she understood and would be here for her test on Wednesday Feb17, 2021. S.Artice Bergerson EMTP

## 2019-04-21 NOTE — Telephone Encounter (Signed)
Patient given detailed instructions per Myocardial Perfusion Study Information Sheet for the test on 04/28/2019 at 0730. Patient notified to arrive 15 minutes early and that it is imperative to arrive on time for appointment to keep from having the test rescheduled.  If you need to cancel or reschedule your appointment, please call the office within 24 hours of your appointment. . Patient verbalized understanding.Jasneet Schobert, Ranae Palms No mychart available

## 2019-04-23 ENCOUNTER — Telehealth: Payer: Self-pay | Admitting: Neurology

## 2019-04-23 NOTE — Telephone Encounter (Signed)
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

## 2019-04-23 NOTE — Telephone Encounter (Signed)
Spoke with patient and advised usually wait 5 days from sick contact, she does not have symptoms. She does want a virtual visit with Shreyansh Tiffany to discuss. I scheduled for Monday morning. She is not interested in Covid testing at community sites ([facility], etc).

## 2019-04-26 ENCOUNTER — Encounter: Payer: 59 | Admitting: Physician Assistant

## 2019-04-26 NOTE — Progress Notes (Signed)
Had some kids at daycare with symptoms of vomiting and she would like Covid tested

## 2019-04-28 ENCOUNTER — Other Ambulatory Visit: Payer: Self-pay

## 2019-04-28 ENCOUNTER — Ambulatory Visit (HOSPITAL_COMMUNITY): Payer: 59 | Attending: Cardiovascular Disease

## 2019-04-28 VITALS — Ht 62.0 in | Wt 200.0 lb

## 2019-04-28 DIAGNOSIS — R0789 Other chest pain: Secondary | ICD-10-CM | POA: Diagnosis present

## 2019-04-28 DIAGNOSIS — R079 Chest pain, unspecified: Secondary | ICD-10-CM | POA: Diagnosis present

## 2019-04-28 LAB — MYOCARDIAL PERFUSION IMAGING
LV dias vol: 48 mL (ref 46–106)
LV sys vol: 15 mL
Peak HR: 125 {beats}/min
Rest HR: 87 {beats}/min
SDS: 5
SRS: 0
SSS: 5
TID: 0.75

## 2019-04-28 MED ORDER — REGADENOSON 0.4 MG/5ML IV SOLN
0.4000 mg | Freq: Once | INTRAVENOUS | Status: AC
  Administered 2019-04-28: 0.4 mg via INTRAVENOUS

## 2019-04-28 MED ORDER — TECHNETIUM TC 99M TETROFOSMIN IV KIT
10.2000 | PACK | Freq: Once | INTRAVENOUS | Status: AC | PRN
  Administered 2019-04-28: 10.2 via INTRAVENOUS
  Filled 2019-04-28: qty 11

## 2019-04-28 MED ORDER — TECHNETIUM TC 99M TETROFOSMIN IV KIT
31.1000 | PACK | Freq: Once | INTRAVENOUS | Status: AC | PRN
  Administered 2019-04-28: 31.1 via INTRAVENOUS
  Filled 2019-04-28: qty 32

## 2019-05-05 ENCOUNTER — Ambulatory Visit (INDEPENDENT_AMBULATORY_CARE_PROVIDER_SITE_OTHER): Payer: 59 | Admitting: Cardiology

## 2019-05-05 ENCOUNTER — Encounter: Payer: Self-pay | Admitting: Cardiology

## 2019-05-05 ENCOUNTER — Ambulatory Visit (INDEPENDENT_AMBULATORY_CARE_PROVIDER_SITE_OTHER): Payer: 59 | Admitting: Physician Assistant

## 2019-05-05 ENCOUNTER — Other Ambulatory Visit: Payer: Self-pay

## 2019-05-05 VITALS — BP 130/94 | HR 88 | Ht 63.0 in | Wt 212.0 lb

## 2019-05-05 VITALS — BP 127/80 | HR 86 | Ht 63.0 in | Wt 211.0 lb

## 2019-05-05 DIAGNOSIS — R053 Chronic cough: Secondary | ICD-10-CM

## 2019-05-05 DIAGNOSIS — R062 Wheezing: Secondary | ICD-10-CM

## 2019-05-05 DIAGNOSIS — I6521 Occlusion and stenosis of right carotid artery: Secondary | ICD-10-CM

## 2019-05-05 DIAGNOSIS — K219 Gastro-esophageal reflux disease without esophagitis: Secondary | ICD-10-CM | POA: Diagnosis not present

## 2019-05-05 DIAGNOSIS — R0789 Other chest pain: Secondary | ICD-10-CM

## 2019-05-05 DIAGNOSIS — E785 Hyperlipidemia, unspecified: Secondary | ICD-10-CM | POA: Diagnosis not present

## 2019-05-05 DIAGNOSIS — R7303 Prediabetes: Secondary | ICD-10-CM | POA: Diagnosis not present

## 2019-05-05 DIAGNOSIS — R059 Cough, unspecified: Secondary | ICD-10-CM

## 2019-05-05 DIAGNOSIS — R030 Elevated blood-pressure reading, without diagnosis of hypertension: Secondary | ICD-10-CM

## 2019-05-05 DIAGNOSIS — R05 Cough: Secondary | ICD-10-CM | POA: Diagnosis not present

## 2019-05-05 MED ORDER — ALBUTEROL SULFATE HFA 108 (90 BASE) MCG/ACT IN AERS
2.0000 | INHALATION_SPRAY | Freq: Once | RESPIRATORY_TRACT | Status: AC
  Administered 2019-05-05: 2 via RESPIRATORY_TRACT

## 2019-05-05 MED ORDER — FAMOTIDINE 20 MG PO TABS: 20.0000 mg | ORAL_TABLET | Freq: Every day | ORAL | 1 refills | Status: DC

## 2019-05-05 NOTE — Patient Instructions (Addendum)
Need to get endoscopy.  Will get CT of chest.

## 2019-05-05 NOTE — Patient Instructions (Signed)
Medication Instructions:  Your physician recommends that you continue on your current medications as directed. Please refer to the Current Medication list given to you today.  *If you need a refill on your cardiac medications before your next appointment, please call your pharmacy*  Lab Work: Your physician recommends that you return for lab work in 2 months: lipids  If you have labs (blood work) drawn today and your tests are completely normal, you will receive your results only by: [name] MyChart Message (if you have MyChart) OR . A paper copy in the mail If you have any lab test that is abnormal or we need to change your treatment, we will call you to review the results.  Testing/Procedures: Your physician has requested that you have a carotid duplex. This test is an ultrasound of the carotid arteries in your neck. It looks at blood flow through these arteries that supply the brain with blood. Allow one hour for this exam. There are no restrictions or special instructions.      Follow-Up: At [facility], you and your health needs are our priority.  As part of our continuing mission to provide you with exceptional heart care, we have created designated Provider Care Teams.  These Care Teams include your primary Cardiologist (physician) and Advanced Practice Providers (APPs -  Physician Assistants and Nurse Practitioners) who all work together to provide you with the care you need, when you need it.  Your next appointment:   5 month(s)  The format for your next appointment:   In Person  Provider:   Jenne Campus, MD  Other Instructions

## 2019-05-05 NOTE — Addendum Note (Signed)
Addended by: Ashok Norris on: 05/05/2019 09:51 AM   Modules accepted: Orders

## 2019-05-05 NOTE — Progress Notes (Signed)
Pt here to have spirometry done. She was able to perform with no complications.Maryruth Eve, Lahoma Crocker, CMA

## 2019-05-05 NOTE — Progress Notes (Signed)
Subjective:    Patient ID: Olivia Miller, female    DOB: February 28, 1961, 59 y.o.   MRN: [id number]  HPI  Pt is a 59 yo obese female with persistent cough who presents to the clinic for spirometry.   She has had years of intermittent cough "spells". Prednisone usually resolved them. This cough prednisone has not touched and ongoing since December. She does not feel any different with albuterol. Cough a little worse at night.   No medication that will cause cough  Endoscopy  .[name] Active Ambulatory Problems    Diagnosis Date Noted  . Dyslipidemia 03/03/2015  . Tachycardia 03/03/2015  . Left cervical radiculopathy 03/03/2015  . OAB (overactive bladder) 03/03/2015  . DDD (degenerative disc disease), cervical 03/03/2015  . Calcification of right carotid artery 06/07/2015  . Snoring 04/02/2016  . Apneic episode 04/02/2016  . Non-restorative sleep 04/02/2016  . Clostridium difficile colitis 04/18/2016  . Breast mass, right 04/23/2016  . Cervical polyp 08/06/2016  . Tinea pedis of left foot 08/06/2016  . Fibromyalgia 03/10/2017  . Nasal congestion 03/10/2017  . Palpitations 03/10/2017  . Esophageal dysphagia 03/10/2017  . Class 2 severe obesity due to excess calories with serious comorbidity and body mass index (BMI) of 35.0 to 35.9 in adult ([place]) 03/12/2017  . Family history of bone cancer 03/12/2017  . Chronic bilateral thoracic back pain 03/12/2017  . Chronic bilateral low back pain without sciatica 03/12/2017  . Female cystocele 02/04/2018  . Rectocele 02/04/2018  . Chronic pain of both knees 02/04/2018  . Primary osteoarthritis of knee 02/04/2018  . Plantar fasciitis of right foot 02/16/2018  . Irritant contact dermatitis due to detergent 07/15/2018  . Onychomycosis 07/15/2018  . Vitamin D insufficiency 07/15/2018  . Cough 03/25/2019  . Gastroesophageal reflux disease 03/25/2019  . Wheezing 03/25/2019  . Chronic rhinitis 03/25/2019  . Neck pain 04/05/2019  . Elevated blood  pressure reading 04/05/2019  . Pre-diabetes 04/05/2019  . Atypical chest pain 04/07/2019   Resolved Ambulatory Problems    Diagnosis Date Noted  . No Resolved Ambulatory Problems   Past Medical History:  Diagnosis Date  . Hyperlipidemia     Review of Systems See HPI.     Objective:   Physical Exam Vitals reviewed.  Constitutional:      Appearance: Normal appearance. She is obese.  HENT:     Head: Normocephalic.  Cardiovascular:     Rate and Rhythm: Normal rate and regular rhythm.     Pulses: Normal pulses.  Pulmonary:     Effort: Pulmonary effort is normal.     Breath sounds: Normal breath sounds.  Abdominal:     Tenderness: There is no abdominal tenderness.  Neurological:     General: No focal deficit present.     Mental Status: She is alert and oriented to person, place, and time.  Psychiatric:        Mood and Affect: Mood normal.           Assessment & Plan:  Marland KitchenMarland KitchenTatanya was seen today for spirometry.  Diagnoses and all orders for this visit:  Persistent cough -     albuterol (VENTOLIN HFA) 108 (90 Base) MCG/ACT inhaler 2 puff  Wheezing  Gastroesophageal reflux disease, unspecified whether esophagitis present  Other orders -     famotidine (PEPCID) 20 MG tablet; Take 1 tablet (20 mg total) by mouth daily.  FEV1 74 percent FVC 71 percent Change 4 percent  Unclear etiology of cough but suspect Upper Airway cough syndrome.  spirometry essentially normal FEV1 slightly decreased but no significant change after albuterol treatment.   Discussed UACS and triad of symptoms asthma, allergy, GERD.  Stop albuterol if really not helping.  Continue on claritin.  Continue on omeprazole.  Add pepcid at dinner.  Ordered CT for further evaluation.  Will send to GI to consider endoscopy for GERD and cough.   Spent 35 minutes with patient.

## 2019-05-05 NOTE — Progress Notes (Signed)
Cardiology Office Note:    Date:  05/05/2019   ID:  [name], DOB [birth year], MRN [id number]  PCP:  Donella Stade, PA-C  Cardiologist:  Jenne Campus, MD    Referring MD: Donella Stade, PA-C   No chief complaint on file. Doing well  History of Present Illness:    Olivia Miller is a 59 y.o. female who was referred to [place] originally because of atypical chest pain.  Echocardiogram has been done which showed normal left ventricle ejection fraction, mild mitral regurgitation, stress test was done which showed no evidence of ischemia.  She comes today to talk about dose test.  Overall she is doing well.  Denies having any symptoms now no shortness of breath chest pain tightness squeezing pressure burning chest.  Past Medical History:  Diagnosis Date  . Hyperlipidemia     History reviewed. No pertinent surgical history.  Current Medications: Current Meds  Medication Sig  . albuterol (VENTOLIN HFA) 108 (90 Base) MCG/ACT inhaler Inhale 2 puffs into the lungs every 6 (six) hours as needed for wheezing or shortness of breath. APPT FOR FURTHER REFILLS  . aspirin EC 81 MG tablet Take 1 tablet (81 mg total) by mouth daily.  . Cholecalciferol (VITAMIN D-3) 5000 units TABS Take by mouth.  . loratadine (CLARITIN) 10 MG tablet Take 1 tablet (10 mg total) by mouth daily.  . Omega-3 Fatty Acids (FISH OIL) 1000 MG CAPS Take by mouth.  [name] omeprazole (PRILOSEC) 40 MG capsule Take 1 capsule (40 mg total) by mouth 2 (two) times daily.  . Plant Sterols and Stanols (CHOLESTOFF PLUS PO) Take 2 tablets by mouth daily.  . Probiotic Product (PROBIOTIC PO) Take by mouth.     Allergies:   Patient has no known allergies.   Social History   Socioeconomic History  . Marital status: Married    Spouse name: Not on file  . Number of children: Not on file  . Years of education: Not on file  . Highest education level: Not on file  Occupational History  . Not on file  Tobacco Use  . Smoking  status: Never Smoker  . Smokeless tobacco: Never Used  Substance and Sexual Activity  . Alcohol use: No    Alcohol/week: 0.0 standard drinks  . Drug use: No  . Sexual activity: Yes    Birth control/protection: Post-menopausal  Other Topics Concern  . Not on file  Social History Narrative  . Not on file   Social Determinants of Health   Financial Resource Strain:   . Difficulty of Paying Living Expenses: Not on file  Food Insecurity:   . Worried About Charity fundraiser in the Last Year: Not on file  . Ran Out of Food in the Last Year: Not on file  Transportation Needs:   . Lack of Transportation (Medical): Not on file  . Lack of Transportation (Non-Medical): Not on file  Physical Activity:   . Days of Exercise per Week: Not on file  . Minutes of Exercise per Session: Not on file  Stress:   . Feeling of Stress : Not on file  Social Connections:   . Frequency of Communication with Friends and Family: Not on file  . Frequency of Social Gatherings with Friends and Family: Not on file  . Attends Religious Services: Not on file  . Active Member of Clubs or Organizations: Not on file  . Attends Archivist Meetings: Not on file  . Marital Status: Not on  file     Family History: The patient's family history includes Bone cancer in her father; Cancer in her father; Colon cancer in her maternal aunt; Stroke in her father. ROS:   Please see the history of present illness.    All 14 point review of systems negative except as described per history of present illness  EKGs/Labs/Other Studies Reviewed:      Recent Labs: 06/25/2018: ALT 15; BUN 18; Creat 0.95; Hemoglobin 13.6; Platelets 276; Potassium 4.3; Sodium 139; TSH 4.45  Recent Lipid Panel    Component Value Date/Time   CHOL 252 (H) 06/25/2018 0813   TRIG 111 06/25/2018 0813   HDL 46 (L) 06/25/2018 0813   CHOLHDL 5.5 (H) 06/25/2018 0813   VLDL 26 04/10/2016 1130   LDLCALC 182 (H) 06/25/2018 0813    Physical  Exam:    VS:  BP (!) 130/94   Pulse 88   Ht 5\' 3"  (1.6 m)   Wt 212 lb (96.2 kg)   LMP 05/21/2015 (Within Days)   SpO2 98%   BMI 37.55 kg/m     Wt Readings from Last 3 Encounters:  05/05/19 212 lb (96.2 kg)  04/28/19 200 lb (90.7 kg)  04/07/19 200 lb (90.7 kg)     GEN:  Well nourished, well developed in no acute distress HEENT: Normal NECK: No JVD; No carotid bruits LYMPHATICS: No lymphadenopathy CARDIAC: RRR, no murmurs, no rubs, no gallops RESPIRATORY:  Clear to auscultation without rales, wheezing or rhonchi  ABDOMEN: Soft, non-tender, non-distended MUSCULOSKELETAL:  No edema; No deformity  SKIN: Warm and dry LOWER EXTREMITIES: no swelling NEUROLOGIC:  Alert and oriented x 3 PSYCHIATRIC:  Normal affect   ASSESSMENT:    1. Atypical chest pain   2. Pre-diabetes   3. Elevated blood pressure reading   4. Dyslipidemia   5. Calcification of right carotid artery    PLAN:    In order of problems listed above:  1. Atypical chest pain stress test negative.  We did talk about risk factors modifications.  We did talk about Mediterranean diet as well as the need to exercise on a regular basis.  We also talked about cholesterol-lowering medications. 2. Prediabetes: With exercise hopefully that problem will be improving.  Again we talked in length about that issue. 3. Essential hypertension blood pressure appears to be controlled ask her to check blood pressure on the regular basis from home.  We will continue present medications. 4. Dyslipidemia I gave her prescription for Lipitor however she never got it.  She went to over-the-counter medication being scared of side effect of Lipitor.  We spent a great of time talking about this.  Her cholesterol still not well controlled she preferred to do exercises and take over-the-counter medication for about 2 months and then she agreed to have her cholesterol rechecked and then she promises that she will go on cholesterol medication if that  will not be sufficient.  I told her that muscle aches are one of the potential side effect and that is what she was scared of but I also told her that it is very rare. 5. Calcification of carotic artery.  We will schedule her to have carotic ultrasound.   Medication Adjustments/Labs and Tests Ordered: Current medicines are reviewed at length with the patient today.  Concerns regarding medicines are outlined above.  No orders of the defined types were placed in this encounter.  Medication changes: No orders of the defined types were placed in this encounter.  Signed, Park Liter, MD, [facility] 05/05/2019 9:42 AM    [place]

## 2019-05-14 ENCOUNTER — Telehealth: Payer: Self-pay

## 2019-05-14 DIAGNOSIS — R053 Chronic cough: Secondary | ICD-10-CM

## 2019-05-14 DIAGNOSIS — R05 Cough: Secondary | ICD-10-CM

## 2019-05-14 NOTE — Telephone Encounter (Signed)
CT denied by insurance due to [place] not being able to do a methacholine test. This has to be done by pulmonology. Patient okay with pulmonology referral. Pended.

## 2019-05-14 NOTE — Telephone Encounter (Signed)
Done

## 2019-05-19 ENCOUNTER — Ambulatory Visit (HOSPITAL_BASED_OUTPATIENT_CLINIC_OR_DEPARTMENT_OTHER)
Admission: RE | Admit: 2019-05-19 | Discharge: 2019-05-19 | Disposition: A | Discharge status: Home / Self Care | Payer: 59 | Source: Ambulatory Visit | Attending: Cardiology | Admitting: Cardiology

## 2019-05-19 ENCOUNTER — Other Ambulatory Visit: Payer: Self-pay

## 2019-05-19 DIAGNOSIS — I6521 Occlusion and stenosis of right carotid artery: Secondary | ICD-10-CM | POA: Insufficient documentation

## 2019-05-19 DIAGNOSIS — E785 Hyperlipidemia, unspecified: Secondary | ICD-10-CM | POA: Diagnosis present

## 2019-05-19 NOTE — Progress Notes (Signed)
  Echocardiogram 2D Echocardiogram has been performed.  Cardell Peach 05/19/2019, 8:42 AM

## 2019-05-21 NOTE — Addendum Note (Signed)
Addended by: Clemetine Marker A on: 05/21/2019 09:18 AM   Modules accepted: Orders

## 2019-05-24 ENCOUNTER — Telehealth: Payer: Self-pay | Admitting: Emergency Medicine

## 2019-05-24 NOTE — Telephone Encounter (Signed)
Left message for patient to return call regarding results  

## 2019-05-24 NOTE — Telephone Encounter (Signed)
Patient informed of results.  

## 2019-05-24 NOTE — Telephone Encounter (Signed)
Patient states she is returning call in regards to results. Please call.

## 2019-06-04 ENCOUNTER — Telehealth: Payer: Self-pay | Admitting: Neurology

## 2019-06-04 NOTE — Telephone Encounter (Signed)
Called patient back and let her know this was sent. She states they don't have it. Sent to referral coordinator to resend.

## 2019-06-04 NOTE — Telephone Encounter (Signed)
Patient called to check on GI referral to make sure referral faxed to [facility]. It looks like referral sent on 05/11/2019.

## 2019-06-04 NOTE — Telephone Encounter (Signed)
Re faxed referral today to [facility]

## 2019-06-15 ENCOUNTER — Encounter: Payer: Self-pay | Admitting: Internal Medicine

## 2019-07-09 ENCOUNTER — Institutional Professional Consult (permissible substitution): Payer: 59 | Admitting: Internal Medicine

## 2019-07-21 ENCOUNTER — Encounter: Payer: Self-pay | Admitting: Pulmonary Disease

## 2019-07-21 ENCOUNTER — Ambulatory Visit (INDEPENDENT_AMBULATORY_CARE_PROVIDER_SITE_OTHER): Payer: 59 | Admitting: Pulmonary Disease

## 2019-07-21 ENCOUNTER — Ambulatory Visit (INDEPENDENT_AMBULATORY_CARE_PROVIDER_SITE_OTHER): Payer: 59

## 2019-07-21 ENCOUNTER — Other Ambulatory Visit: Payer: Self-pay

## 2019-07-21 VITALS — BP 130/68 | HR 90 | Temp 97.5°F | Ht 63.0 in | Wt 205.2 lb

## 2019-07-21 DIAGNOSIS — R059 Cough, unspecified: Secondary | ICD-10-CM

## 2019-07-21 DIAGNOSIS — R05 Cough: Secondary | ICD-10-CM

## 2019-07-21 NOTE — Patient Instructions (Signed)
Thank you for trusting [place] with your care. We have review your limited pulmonary function test and the results are no conclusive. We would like to have you go for a full pulmonary function test and get a chest xray. Looking at your history and previous imaging we think it is important you follow up with your gastroenterologist in July as planned. Because you are having some wheezing we will order blood work,  CBC and IgE. We will have you follow up after these results are back. We will consider a CT Chest in the future if needed after this initial workup.   CBC IgE Full PFT's Chest Xray

## 2019-07-21 NOTE — Progress Notes (Signed)
Olivia Miller    [id number]    07-27-1960  Primary Care Physician:Breeback, Royetta Car, PA-C  Referring Physician: Donella Stade, PA-C [address] [place],  [place] [id number]  Chief complaint:   Subacute Cough   HPI: Olivia Miller is a 59 year old female with a past medical history of GERD, esophageal stricture, and seasonal allergies who presents for chronic cough. Patient has had a cough for approximately 1-2 years. The cough is worst at night and in the morning. She has found releif with medication she was prescribed in the past for 5 days ( I believe she is referring to a Z pack). She does not take an Ace inhibitor, not currently taking any medications, and feels cough is getting worse. Recently noticed some wheezing in the last few months and some SOB with activity. She recently went to ED for chest pain, but reports ACS was ruled out.    Pets: Dog, rockweiler , for 5 years . Occupation: Home daycare Exposures: no hot tub, no down comforters or pillows  Smoking history: never smoker Travel history: no travel history  Relevant family history: Father had COPD at a later age after smoking for many years   Not taking any of these medications listed  Outpatient Encounter Medications as of 07/21/2019  Medication Sig  . aspirin EC 81 MG tablet Take 1 tablet (81 mg total) by mouth daily.  . Cholecalciferol (VITAMIN D-3) 5000 units TABS Take by mouth.  . famotidine (PEPCID) 20 MG tablet Take 1 tablet (20 mg total) by mouth daily.  [name] loratadine (CLARITIN) 10 MG tablet Take 1 tablet (10 mg total) by mouth daily.  . Omega-3 Fatty Acids (FISH OIL) 1000 MG CAPS Take by mouth.  [name] omeprazole (PRILOSEC) 40 MG capsule Take 1 capsule (40 mg total) by mouth 2 (two) times daily.  . Plant Sterols and Stanols (CHOLESTOFF PLUS PO) Take 2 tablets by mouth daily.  . Probiotic Product (PROBIOTIC PO) Take by mouth.   No facility-administered encounter medications on file as  of 07/21/2019.    Allergies as of 07/21/2019  . (No Known Allergies)    Past Medical History:  Diagnosis Date  . Hyperlipidemia     No past surgical history on file.  Family History  Problem Relation Age of Onset  . Cancer Father   . Stroke Father   . Bone cancer Father   . Colon cancer Maternal Aunt     Social History   Socioeconomic History  . Marital status: Married    Spouse name: Not on file  . Number of children: Not on file  . Years of education: Not on file  . Highest education level: Not on file  Occupational History  . Not on file  Tobacco Use  . Smoking status: Never Smoker  . Smokeless tobacco: Never Used  Substance and Sexual Activity  . Alcohol use: No    Alcohol/week: 0.0 standard drinks  . Drug use: No  . Sexual activity: Yes    Birth control/protection: Post-menopausal  Other Topics Concern  . Not on file  Social History Narrative  . Not on file   Social Determinants of Health   Financial Resource Strain:   . Difficulty of Paying Living Expenses:   Food Insecurity:   . Worried About Charity fundraiser in the Last Year:   . Arboriculturist in the Last Year:   Transportation Needs:   .  Lack of Transportation (Medical):   [name] Lack of Transportation (Non-Medical):   Physical Activity:   . Days of Exercise per Week:   . Minutes of Exercise per Session:   Stress:   . Feeling of Stress :   Social Connections:   . Frequency of Communication with Friends and Family:   . Frequency of Social Gatherings with Friends and Family:   . Attends Religious Services:   . Active Member of Clubs or Organizations:   . Attends Archivist Meetings:   [name] Marital Status:   Intimate Partner Violence:   . Fear of Current or Ex-Partner:   . Emotionally Abused:   [name] Physically Abused:   . Sexually Abused:     Review of systems: Review of Systems  Constitutional: Negative for fever and chills.  HENT: Negative.   Eyes: Negative for blurred vision.    Respiratory: as per HPI  Cardiovascular: Negative for chest pain and palpitations.  Gastrointestinal: Negative for vomiting, diarrhea, blood per rectum. Genitourinary: Negative for dysuria, urgency, frequency and hematuria.  Musculoskeletal: Negative for myalgias, back pain and joint pain.  Skin: Negative for itching and rash.  Neurological: Negative for dizziness, tremors, focal weakness, seizures and loss of consciousness.  Endo/Heme/Allergies: Negative for environmental allergies.  Psychiatric/Behavioral: Negative for depression, suicidal ideas and hallucinations.  All other systems reviewed and are negative.  Physical Exam: Last menstrual period 05/21/2015. General: NAD, nl appearance HE: Normocephalic, atraumatic , EOMI, Conjunctivae normal ENT: No congestion, no rhinorrhea, no exudate or erythema  Cardiovascular: Normal rate, regular rhythm.  No murmurs, rubs, or gallops Pulmonary : Effort normal, breath sounds normal. No wheezes, rales, or rhonchi Abdominal: soft, nontender,  bowel sounds present Musculoskeletal: no swelling , deformity, injury ,or tenderness in extremities, Skin: Warm, dry , no bruising, erythema, or rash Psychiatric/Behavioral:  normal mood, normal behavior    Data Reviewed: Imaging:  Chest Xray 04/02/2019 : Large gastric bubble, no abnormal pulmonary findings  PFTs:  Spirometry: FVC 1.84 (71%) FEV1 1.52 (74%) F/F 82.8 (104%)  Quality study , No obstruction, possible restrictive process  Labs:   Assessment:   Patient presents with a chronic cough (2 years) , and has progressively worsened. Reports wheezing recently. Scheduled for further workup with GI for GERD. Her history points to this as a likely cause of chronic cough. However there is some restriction on her spirometry , so we will further workup with full PFT's. With history of wheezing we will order a CBC and IgE. Her last chest xray did not show any pulmnary finding, but we will order a new  chest xray to look for any recent changes. After these results are back patient can be scheduled for follow up to review them. We will consider Chest CT and any further testing if needed after these results are back.   Plan/Recommendations: CBC IgE Full PFT's Chest Xray   Tamsen Snider, MD PGY1   Attending note: I have seen and examined the patient. History, labs and imaging reviewed. Agree with assessment and plan as noted  Patient with nonproductive cough, wheezing, dyspnea on exertion for several years Being worked up by GI for esophagitis and stricture.  Scheduled for EGD, colonoscopy in the near future She is not on PPI at present and prefers to wait until her GI evaluation is complete before trying  Spirometry reviewed with possible restrictive process We will check chest x-ray, full PFTs CBC differential, IgE to evaluate asthma She is currently on albuterol rescue inhaler.  We  will continue this for now pending review of this test.  Marshell Garfinkel MD [facility] 07/21/2019, 10:15 AM  CC: Donella Stade, PA-C

## 2019-07-22 ENCOUNTER — Telehealth: Payer: Self-pay | Admitting: Pulmonary Disease

## 2019-07-22 DIAGNOSIS — R059 Cough, unspecified: Secondary | ICD-10-CM

## 2019-07-22 NOTE — Telephone Encounter (Signed)
Called and spoke with pt to clarify what she was needing. She stated that she tried to go to her PCP to have the labwork done there since [facility] but they could not draw the labs until they were released.  I reput the orders in and on the order review tab, hit release. Stated to pt to call PCP prior to going to see if that worked with what I did and she verbalized understanding. She stated if they still weren't able to do it, she would have the lab at PCP call our office. Nothing further needed.

## 2019-07-22 NOTE — Telephone Encounter (Signed)
Spoke with the pt and she states labs need to be resulted by [facility] and both released to be done in [place]  I have done this in [place] and advised her to call back if there is any other issue with the orders

## 2019-07-23 LAB — CBC WITH DIFFERENTIAL/PLATELET
Absolute Monocytes: 408 cells/uL (ref 200–950)
Basophils Absolute: 29 cells/uL (ref 0–200)
Basophils Relative: 0.6 %
Eosinophils Absolute: 379 cells/uL (ref 15–500)
Eosinophils Relative: 7.9 %
HCT: 41.3 % (ref 35.0–45.0)
Hemoglobin: 13.3 g/dL (ref 11.7–15.5)
Lymphs Abs: 1680 cells/uL (ref 850–3900)
MCH: 29.2 pg (ref 27.0–33.0)
MCHC: 32.2 g/dL (ref 32.0–36.0)
MCV: 90.8 fL (ref 80.0–100.0)
MPV: 9.8 fL (ref 7.5–12.5)
Monocytes Relative: 8.5 %
Neutro Abs: 2304 cells/uL (ref 1500–7800)
Neutrophils Relative %: 48 %
Platelets: 279 10*3/uL (ref 140–400)
RBC: 4.55 10*6/uL (ref 3.80–5.10)
RDW: 13.1 % (ref 11.0–15.0)
Total Lymphocyte: 35 %
WBC: 4.8 10*3/uL (ref 3.8–10.8)

## 2019-07-27 LAB — IGE: IgE (Immunoglobulin E), Serum: 12 kU/L (ref <=114)

## 2019-07-28 NOTE — Progress Notes (Signed)
Patient identification verified. Results of recent CXR reviewed.  Per Dr. Vaughan Browner, chest x-ray shows slight elevation in left diaphragm which appears chronic.  There is no acute process.  Patient verbalized understanding of results after review.

## 2019-07-29 ENCOUNTER — Telehealth: Payer: Self-pay | Admitting: Pulmonary Disease

## 2019-07-29 NOTE — Telephone Encounter (Signed)
Spoke with the pt and she is asking for her lab results  Please advise, thanks!

## 2019-07-30 NOTE — Telephone Encounter (Signed)
Left detailed msg on machine letting the pt know labs normal ok per DPR.

## 2019-07-30 NOTE — Telephone Encounter (Signed)
Labs are normal.

## 2019-09-10 LAB — HM COLONOSCOPY

## 2019-09-16 ENCOUNTER — Encounter: Payer: Self-pay | Admitting: Physician Assistant

## 2019-10-21 ENCOUNTER — Other Ambulatory Visit: Payer: Self-pay | Admitting: Nurse Practitioner

## 2019-10-21 DIAGNOSIS — R059 Cough, unspecified: Secondary | ICD-10-CM

## 2019-10-21 DIAGNOSIS — R05 Cough: Secondary | ICD-10-CM

## 2019-10-21 DIAGNOSIS — J0140 Acute pansinusitis, unspecified: Secondary | ICD-10-CM

## 2019-10-21 DIAGNOSIS — R42 Dizziness and giddiness: Secondary | ICD-10-CM

## 2019-10-21 MED ORDER — AMOXICILLIN-POT CLAVULANATE 875-125 MG PO TABS: 1.0000 | ORAL_TABLET | Freq: Two times a day (BID) | ORAL | 0 refills | Status: DC

## 2019-10-21 MED ORDER — MECLIZINE HCL 25 MG PO TABS: 25.0000 mg | ORAL_TABLET | Freq: Three times a day (TID) | ORAL | 3 refills | Status: DC | PRN

## 2019-11-08 ENCOUNTER — Ambulatory Visit: Payer: 59 | Admitting: Obstetrics and Gynecology

## 2019-11-12 ENCOUNTER — Ambulatory Visit (INDEPENDENT_AMBULATORY_CARE_PROVIDER_SITE_OTHER): Payer: 59 | Admitting: Family Medicine

## 2019-11-12 ENCOUNTER — Other Ambulatory Visit: Payer: Self-pay

## 2019-11-12 ENCOUNTER — Other Ambulatory Visit (HOSPITAL_COMMUNITY)
Admission: RE | Admit: 2019-11-12 | Discharge: 2019-11-12 | Disposition: A | Discharge status: Home / Self Care | Payer: 59 | Source: Ambulatory Visit | Attending: Family Medicine | Admitting: Family Medicine

## 2019-11-12 ENCOUNTER — Encounter: Payer: Self-pay | Admitting: Family Medicine

## 2019-11-12 VITALS — BP 133/87 | HR 88 | Ht 62.0 in | Wt 203.0 lb

## 2019-11-12 DIAGNOSIS — N816 Rectocele: Secondary | ICD-10-CM

## 2019-11-12 DIAGNOSIS — Z01419 Encounter for gynecological examination (general) (routine) without abnormal findings: Secondary | ICD-10-CM

## 2019-11-12 DIAGNOSIS — Z124 Encounter for screening for malignant neoplasm of cervix: Secondary | ICD-10-CM | POA: Insufficient documentation

## 2019-11-12 DIAGNOSIS — R928 Other abnormal and inconclusive findings on diagnostic imaging of breast: Secondary | ICD-10-CM

## 2019-11-12 NOTE — Assessment & Plan Note (Signed)
Has seen specialist and did not want any intervention. No change and no issues with bowel function. It does not bother her.

## 2019-11-12 NOTE — Progress Notes (Signed)
  Subjective:     Olivia Miller is a 59 y.o. female and is here for a comprehensive physical exam. The patient reports no problems. No further PMB after cervical polyp removal. Has had a rectocele. Did not elect to have surgery. Had abnl mammogram in 2019 and has not had f/u.  The following portions of the patient's history were reviewed and updated as appropriate: allergies, current medications, past family history, past medical history, past social history, past surgical history and problem list.  Review of Systems Pertinent items noted in HPI and remainder of comprehensive ROS otherwise negative.   Objective:    BP 133/87   Pulse 88   Ht 5\' 2"  (1.575 m)   Wt 203 lb (92.1 kg)   LMP 05/21/2015 (Within Days)   BMI 37.13 kg/m  General appearance: alert, cooperative and appears stated age Head: Normocephalic, without obvious abnormality, atraumatic Neck: no adenopathy, supple, symmetrical, trachea midline and thyroid not enlarged, symmetric, no tenderness/mass/nodules Lungs: clear to auscultation bilaterally Breasts: normal appearance, no masses or tenderness Heart: regular rate and rhythm, S1, S2 normal, no murmur, click, rub or gallop Abdomen: soft, non-tender; bowel sounds normal; no masses,  no organomegaly Pelvic: cervix normal in appearance, external genitalia normal, no adnexal masses or tenderness, no cervical motion tenderness, uterus normal size, shape, and consistency and rectocelel present Extremities: extremities normal, atraumatic, no cyanosis or edema Pulses: 2+ and symmetric Skin: Skin color, texture, turgor normal. No rashes or lesions Lymph nodes: Cervical, supraclavicular, and axillary nodes normal. Neurologic: Grossly normal    Assessment:    Healthy female exam.      Plan:   Problem List Items Addressed This Visit      Unprioritized   Rectocele    Has seen specialist and did not want any intervention. No change and no issues with bowel function. It does  not bother her.        Other Visit Diagnoses    Abnormal mammogram    -  Primary   Relevant Orders   MM DIAG BREAST TOMO BILATERAL   US BREAST LTD UNI RIGHT INC AXILLA   Screening for malignant neoplasm of cervix       Relevant Orders   Cytology - PAP( [place])   Encounter for gynecological examination without abnormal finding       Relevant Orders   Cytology - PAP( [place])     Return in 1 year (on 11/11/2020).    See After Visit Summary for Counseling Recommendations

## 2019-11-12 NOTE — Patient Instructions (Signed)

## 2019-11-16 LAB — CYTOLOGY - PAP
Comment: NEGATIVE
Diagnosis: NEGATIVE
High risk HPV: NEGATIVE

## 2019-12-02 ENCOUNTER — Encounter: Payer: Self-pay | Admitting: Medical-Surgical

## 2019-12-02 ENCOUNTER — Other Ambulatory Visit: Payer: Self-pay

## 2019-12-02 ENCOUNTER — Ambulatory Visit (INDEPENDENT_AMBULATORY_CARE_PROVIDER_SITE_OTHER): Payer: 59

## 2019-12-02 ENCOUNTER — Ambulatory Visit (INDEPENDENT_AMBULATORY_CARE_PROVIDER_SITE_OTHER): Payer: 59 | Admitting: Medical-Surgical

## 2019-12-02 VITALS — BP 115/81 | HR 91 | Temp 98.2°F | Ht 62.0 in | Wt 198.3 lb

## 2019-12-02 DIAGNOSIS — M25512 Pain in left shoulder: Secondary | ICD-10-CM | POA: Diagnosis not present

## 2019-12-02 DIAGNOSIS — M542 Cervicalgia: Secondary | ICD-10-CM | POA: Diagnosis not present

## 2019-12-02 DIAGNOSIS — M79602 Pain in left arm: Secondary | ICD-10-CM | POA: Diagnosis not present

## 2019-12-02 MED ORDER — PREDNISONE 50 MG PO TABS: 50.0000 mg | ORAL_TABLET | Freq: Every day | ORAL | 0 refills | Status: DC

## 2019-12-02 MED ORDER — MELOXICAM 15 MG PO TABS: 15.0000 mg | ORAL_TABLET | Freq: Every day | ORAL | 0 refills | Status: DC

## 2019-12-02 NOTE — Progress Notes (Signed)
Subjective:    CC: left arm  HPI: Pleasant 59 year old female presenting today with complaints of left arm, shoulder, and neck pain.  Notes that she has had pain in the left arm for about 1 month.  Pain affects her left shoulder with tenderness of the shoulder joint.  Her upper arm is tender as is the medial and lateral antecubital and forearm.  Soreness over the radial aspect of the wrist and into the first 3 digits of the left hand.  Notes that when her pain started it hurt constantly all the time for days on end.  Pain is described as throbbing.  She was aware of the side effects of statin medications so she stopped taking atorvastatin.  After that she noticed that her pain began to get a little better each day.  Now she is only experiencing pain during the night.  She is able to do her regular functions and activities during the day without discomfort.  She gets regular massages and uses self massage for comfort when her arm begins to hurt.  She has noticed some intermittent tingling in the left hand, first 3 digits.  Subjective weakness to the left arm/hand, reports she is unable to open a door without multiple tries using her left hand.  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  Endorses chronic intermittent left neck pain.  Denies fever, chills, shortness of breath, chest pain, nausea, diaphoresis, palpitations, and lower extremity edema.  I reviewed the past medical history, family history, social history, surgical history, and allergies today and no changes were needed.  Please see the problem list section below in epic for further details.  Past Medical History: Past Medical History:  Diagnosis Date  . GERD (gastroesophageal reflux disease)   . Hyperlipidemia    Past Surgical History: Past Surgical History:  Procedure Laterality Date  . tummy tuck     Social History: Social History   Socioeconomic History  . Marital status: Married    Spouse name: Not on  file  . Number of children: Not on file  . Years of education: Not on file  . Highest education level: Not on file  Occupational History  . Not on file  Tobacco Use  . Smoking status: Never Smoker  . Smokeless tobacco: Never Used  Vaping Use  . Vaping Use: Never used  Substance and Sexual Activity  . Alcohol use: No    Alcohol/week: 0.0 standard drinks  . Drug use: No  . Sexual activity: Yes    Birth control/protection: Post-menopausal  Other Topics Concern  . Not on file  Social History Narrative  . Not on file   Social Determinants of Health   Financial Resource Strain:   . Difficulty of Paying Living Expenses: Not on file  Food Insecurity:   . Worried About Charity fundraiser in the Last Year: Not on file  . Ran Out of Food in the Last Year: Not on file  Transportation Needs:   . Lack of Transportation (Medical): Not on file  . Lack of Transportation (Non-Medical): Not on file  Physical Activity:   . Days of Exercise per Week: Not on file  . Minutes of Exercise per Session: Not on file  Stress:   . Feeling of Stress : Not on file  Social Connections:   . Frequency of Communication with Friends and Family: Not on file  . Frequency of Social Gatherings with Friends and Family: Not on file  . Attends  Religious Services: Not on file  . Active Member of Clubs or Organizations: Not on file  . Attends Archivist Meetings: Not on file  . Marital Status: Not on file   Family History: Family History  Problem Relation Age of Onset  . Cancer Father   . Stroke Father   . Bone cancer Father   . Colon cancer Maternal Aunt    Allergies: No Known Allergies Medications: See med rec.  Review of Systems: See HPI for pertinent positives and negatives.   Objective:    General: Well Developed, well nourished, and in no acute distress.  Neuro: Alert and oriented x3.  HEENT: Normocephalic, atraumatic.  Skin: Warm and dry. Cardiac: Regular rate and rhythm, no  murmurs rubs or gallops, no lower extremity edema.  Respiratory: Clear to auscultation bilaterally. Not using accessory muscles, speaking in full sentences. Left arm/neck: Full range of motion to neck and left upper extremity.  Grips equal bilaterally.  Pulses positive, equal, 2+.  Negative Tinel's and Phalen's signs.  Diffuse tenderness along the musculature of the forearm and upper arm.  Tenderness over the first 3 digits extending up into the hand.  Impression and Recommendations:    1. Cervicalgia Cervical spine x-rays today. - DG Cervical Spine 2 or 3 views; Future  2. Left arm pain Although I suspect this is a soft tissue etiology, we will go ahead and get x-rays of the left forearm and humerus today.  We will go ahead and do a 5-day burst of prednisone to see if this will help.  Also sending in meloxicam 15 mg daily.  Recommend taking for 10 to 14 days consecutively then taking daily as needed. - DG Humerus Left; Future - DG Forearm Left; Future  3. Acute pain of left shoulder Getting shoulder x-rays today. - DG Shoulder Left; Future  Return in about 2 weeks (around 12/16/2019) for left arm pain with Dr. Darene Lamer. ___________________________________________ Clearnce Sorrel, DNP, APRN, FNP-BC [facility]

## 2019-12-03 ENCOUNTER — Ambulatory Visit: Payer: 59

## 2019-12-03 ENCOUNTER — Ambulatory Visit
Admission: RE | Admit: 2019-12-03 | Discharge: 2019-12-03 | Disposition: A | Discharge status: Home / Self Care | Payer: 59 | Source: Ambulatory Visit | Attending: Family Medicine | Admitting: Family Medicine

## 2019-12-03 DIAGNOSIS — R928 Other abnormal and inconclusive findings on diagnostic imaging of breast: Secondary | ICD-10-CM

## 2019-12-06 ENCOUNTER — Other Ambulatory Visit: Payer: Self-pay | Admitting: Medical-Surgical

## 2019-12-06 DIAGNOSIS — M25512 Pain in left shoulder: Secondary | ICD-10-CM

## 2019-12-06 NOTE — Progress Notes (Signed)
Yes I would order CT.

## 2019-12-08 ENCOUNTER — Ambulatory Visit (INDEPENDENT_AMBULATORY_CARE_PROVIDER_SITE_OTHER): Payer: 59

## 2019-12-08 ENCOUNTER — Other Ambulatory Visit: Payer: Self-pay

## 2019-12-08 DIAGNOSIS — M25512 Pain in left shoulder: Secondary | ICD-10-CM | POA: Diagnosis not present

## 2019-12-10 NOTE — Progress Notes (Signed)
Patient could benefit from injection consideration with Dr. Darene Lamer.

## 2020-01-05 ENCOUNTER — Other Ambulatory Visit: Payer: 59

## 2020-01-28 ENCOUNTER — Ambulatory Visit (INDEPENDENT_AMBULATORY_CARE_PROVIDER_SITE_OTHER): Payer: 59 | Admitting: Physician Assistant

## 2020-01-28 ENCOUNTER — Other Ambulatory Visit: Payer: Self-pay

## 2020-01-28 VITALS — BP 138/83 | HR 84 | Ht 62.0 in | Wt 204.0 lb

## 2020-01-28 DIAGNOSIS — E6609 Other obesity due to excess calories: Secondary | ICD-10-CM

## 2020-01-28 DIAGNOSIS — R5383 Other fatigue: Secondary | ICD-10-CM

## 2020-01-28 DIAGNOSIS — R0683 Snoring: Secondary | ICD-10-CM

## 2020-01-28 DIAGNOSIS — Z6837 Body mass index (BMI) 37.0-37.9, adult: Secondary | ICD-10-CM

## 2020-01-28 DIAGNOSIS — G478 Other sleep disorders: Secondary | ICD-10-CM

## 2020-01-28 DIAGNOSIS — Z0000 Encounter for general adult medical examination without abnormal findings: Secondary | ICD-10-CM | POA: Diagnosis not present

## 2020-01-28 DIAGNOSIS — E782 Mixed hyperlipidemia: Secondary | ICD-10-CM | POA: Diagnosis not present

## 2020-01-28 DIAGNOSIS — R7303 Prediabetes: Secondary | ICD-10-CM

## 2020-01-28 NOTE — Progress Notes (Signed)
Subjective:     Olivia Miller is a 59 y.o. female and is here for a comprehensive physical exam. The patient reports problems - problems with weight. she has struggled for years and now interested in bariatric surgery.    She has tried [employer], [employer], slim fast, phentermine, contrave, exercise, portion control over the last 20 years with no significant changes in weight.   She admits to waking up tired, snoring, just feeling not rested.    Social History   Socioeconomic History  . Marital status: Married    Spouse name: Not on file  . Number of children: Not on file  . Years of education: Not on file  . Highest education level: Not on file  Occupational History  . Not on file  Tobacco Use  . Smoking status: Never Smoker  . Smokeless tobacco: Never Used  Vaping Use  . Vaping Use: Never used  Substance and Sexual Activity  . Alcohol use: No    Alcohol/week: 0.0 standard drinks  . Drug use: No  . Sexual activity: Yes    Birth control/protection: Post-menopausal  Other Topics Concern  . Not on file  Social History Narrative  . Not on file   Social Determinants of Health   Financial Resource Strain:   . Difficulty of Paying Living Expenses: Not on file  Food Insecurity:   . Worried About Charity fundraiser in the Last Year: Not on file  . Ran Out of Food in the Last Year: Not on file  Transportation Needs:   . Lack of Transportation (Medical): Not on file  . Lack of Transportation (Non-Medical): Not on file  Physical Activity:   . Days of Exercise per Week: Not on file  . Minutes of Exercise per Session: Not on file  Stress:   . Feeling of Stress : Not on file  Social Connections:   . Frequency of Communication with Friends and Family: Not on file  . Frequency of Social Gatherings with Friends and Family: Not on file  . Attends Religious Services: Not on file  . Active Member of Clubs or Organizations: Not on file  . Attends Archivist Meetings: Not on  file  . Marital Status: Not on file  Intimate Partner Violence:   . Fear of Current or Ex-Partner: Not on file  . Emotionally Abused: Not on file  . Physically Abused: Not on file  . Sexually Abused: Not on file   Health Maintenance  Topic Date Due  . COVID-19 Vaccine (1) 02/13/2020 (Originally 05/19/1972)  . TETANUS/TDAP  11/11/2020 (Originally 05/20/1979)  . INFLUENZA VACCINE  04/02/2036 (Originally 10/10/2019)  . MAMMOGRAM  12/02/2021  . PAP SMEAR-Modifier  11/12/2022  . COLONOSCOPY  09/09/2024  . Hepatitis C Screening  Completed  . HIV Screening  Completed    The following portions of the patient's history were reviewed and updated as appropriate: allergies, current medications, past family history, past medical history, past social history, past surgical history and problem list.  Review of Systems Pertinent items noted in HPI and remainder of comprehensive ROS otherwise negative.   Objective:    BP 138/83   Pulse 84   Ht 5\' 2"  (1.575 m)   Wt 204 lb (92.5 kg)   LMP 05/21/2015 (Within Days)   SpO2 99%   BMI 37.31 kg/m  General appearance: alert, cooperative, appears stated age and moderately obese Head: Normocephalic, without obvious abnormality, atraumatic Eyes: conjunctivae/corneas clear. PERRL, EOM's intact. Fundi benign. Ears: normal TM's  and external ear canals both ears Nose: Nares normal. Septum midline. Mucosa normal. No drainage or sinus tenderness. Throat: lips, mucosa, and tongue normal; teeth and gums normal Neck: no adenopathy, no carotid bruit, no JVD, supple, symmetrical, trachea midline and thyroid not enlarged, symmetric, no tenderness/mass/nodules Back: symmetric, no curvature. ROM normal. No CVA tenderness. Lungs: clear to auscultation bilaterally Heart: regular rate and rhythm, S1, S2 normal, no murmur, click, rub or gallop Abdomen: soft, non-tender; bowel sounds normal; no masses,  no organomegaly Extremities: extremities normal, atraumatic, no  cyanosis or edema Pulses: 2+ and symmetric Skin: Skin color, texture, turgor normal. No rashes or lesions Lymph nodes: Cervical, supraclavicular, and axillary nodes normal. Neurologic: Grossly normal   .. Depression screen [facility] 2/9 01/28/2020 03/25/2019 02/04/2018  Decreased Interest 0 0 0  Down, Depressed, Hopeless 0 0 0  PHQ - 2 Score 0 0 0  Altered sleeping 0 - -  Tired, decreased energy 0 - -  Change in appetite 0 - -  Feeling bad or failure about yourself  0 - -  Trouble concentrating 0 - -  Moving slowly or fidgety/restless 0 - -  Suicidal thoughts 0 - -  PHQ-9 Score 0 - -  Difficult doing work/chores Not difficult at all - -   .[name] GAD 7 : Generalized Anxiety Score 01/28/2020 03/25/2019  Nervous, Anxious, on Edge 0 0  Control/stop worrying 0 0  Worry too much - different things 0 0  Trouble relaxing 0 0  Restless 0 0  Easily annoyed or irritable 0 0  Afraid - awful might happen 0 0  Total GAD 7 Score 0 0  Anxiety Difficulty Not difficult at all Not difficult at all     Assessment:    Healthy female exam.      Plan:    Marland KitchenMarland KitchenElyna was seen today for annual exam.  Diagnoses and all orders for this visit:  Routine physical examination -     COMPLETE METABOLIC PANEL WITH GFR -     Hemoglobin A1c -     Lipid Panel w/reflex Direct LDL -     TSH -     CBC with Differential/Platelet  Pre-diabetes -     COMPLETE METABOLIC PANEL WITH GFR -     Hemoglobin A1c -     CBC with Differential/Platelet -     Home sleep test  Mixed hyperlipidemia -     Lipid Panel w/reflex Direct LDL -     CBC with Differential/Platelet  Other fatigue -     TSH -     Home sleep test  Class 2 obesity due to excess calories without serious comorbidity with body mass index (BMI) of 37.0 to 37.9 in adult -     Home sleep test  Non-restorative sleep -     Home sleep test  Snoring -     Home sleep test   .. Discussed 150 minutes of exercise a week.  Encouraged vitamin D 1000 units  and Calcium 1300mg  or 4 servings of dairy a day.  Fasting labs ordered.  Pt high risk enough to get sleep study done. Ordered today.  Mammogram UTD.  Colonoscopy UTD.  Pap smear UTD.  Declined Tdap, flu, covid, shingles.   Mood is great and stable.   [name].Discussed low carb diet with 1500 calories and 80g of protein.  Exercising at least 150 minutes a week.  [employer] could be a Microbiologist.  Pt is tried numerous things. Filled out  paperwork for bariatric clinic. I think patient would be a great candidate for surgery for weight loss.  See After Visit Summary for Counseling Recommendations

## 2020-01-28 NOTE — Patient Instructions (Addendum)
Will get sleep study ordered.  Get labs.  Follow up 1 year.   Health Maintenance, Female Adopting a healthy lifestyle and getting preventive care are important in promoting health and wellness. Ask your health care provider about:  The right schedule for you to have regular tests and exams.  Things you can do on your own to prevent diseases and keep yourself healthy. What should I know about diet, weight, and exercise? Eat a healthy diet   Eat a diet that includes plenty of vegetables, fruits, low-fat dairy products, and lean protein.  Do not eat a lot of foods that are high in solid fats, added sugars, or sodium. Maintain a healthy weight Body mass index (BMI) is used to identify weight problems. It estimates body fat based on height and weight. Your health care provider can help determine your BMI and help you achieve or maintain a healthy weight. Get regular exercise Get regular exercise. This is one of the most important things you can do for your health. Most adults should:  Exercise for at least 150 minutes each week. The exercise should increase your heart rate and make you sweat (moderate-intensity exercise).  Do strengthening exercises at least twice a week. This is in addition to the moderate-intensity exercise.  Spend less time sitting. Even light physical activity can be beneficial. Watch cholesterol and blood lipids Have your blood tested for lipids and cholesterol at 59 years of age, then have this test every 5 years. Have your cholesterol levels checked more often if:  Your lipid or cholesterol levels are high.  You are older than 59 years of age.  You are at high risk for heart disease. What should I know about cancer screening? Depending on your health history and family history, you may need to have cancer screening at various ages. This may include screening for:  Breast cancer.  Cervical cancer.  Colorectal cancer.  Skin cancer.  Lung cancer. What  should I know about heart disease, diabetes, and high blood pressure? Blood pressure and heart disease  High blood pressure causes heart disease and increases the risk of stroke. This is more likely to develop in people who have high blood pressure readings, are of African descent, or are overweight.  Have your blood pressure checked: ? Every 3-5 years if you are 26-103 years of age. ? Every year if you are 66 years old or older. Diabetes Have regular diabetes screenings. This checks your fasting blood sugar level. Have the screening done:  Once every three years after age 78 if you are at a normal weight and have a low risk for diabetes.  More often and at a younger age if you are overweight or have a high risk for diabetes. What should I know about preventing infection? Hepatitis B If you have a higher risk for hepatitis B, you should be screened for this virus. Talk with your health care provider to find out if you are at risk for hepatitis B infection. Hepatitis C Testing is recommended for:  Everyone born from [birth year] through 1965.  Anyone with known risk factors for hepatitis C. Sexually transmitted infections (STIs)  Get screened for STIs, including gonorrhea and chlamydia, if: ? You are sexually active and are younger than 59 years of age. ? You are older than 59 years of age and your health care provider tells you that you are at risk for this type of infection. ? Your sexual activity has changed since you were last screened, and  you are at increased risk for chlamydia or gonorrhea. Ask your health care provider if you are at risk.  Ask your health care provider about whether you are at high risk for HIV. Your health care provider may recommend a prescription medicine to help prevent HIV infection. If you choose to take medicine to prevent HIV, you should first get tested for HIV. You should then be tested every 3 months for as long as you are taking the medicine. Pregnancy  If  you are about to stop having your period (premenopausal) and you may become pregnant, seek counseling before you get pregnant.  Take 400 to 800 micrograms (mcg) of folic acid every day if you become pregnant.  Ask for birth control (contraception) if you want to prevent pregnancy. Osteoporosis and menopause Osteoporosis is a disease in which the bones lose minerals and strength with aging. This can result in bone fractures. If you are 79 years old or older, or if you are at risk for osteoporosis and fractures, ask your health care provider if you should:  Be screened for bone loss.  Take a calcium or vitamin D supplement to lower your risk of fractures.  Be given hormone replacement therapy (HRT) to treat symptoms of menopause. Follow these instructions at home: Lifestyle  Do not use any products that contain nicotine or tobacco, such as cigarettes, e-cigarettes, and chewing tobacco. If you need help quitting, ask your health care provider.  Do not use street drugs.  Do not share needles.  Ask your health care provider for help if you need support or information about quitting drugs. Alcohol use  Do not drink alcohol if: ? Your health care provider tells you not to drink. ? You are pregnant, may be pregnant, or are planning to become pregnant.  If you drink alcohol: ? Limit how much you use to 0-1 drink a day. ? Limit intake if you are breastfeeding.  Be aware of how much alcohol is in your drink. In the [place] one drink equals one 12 oz bottle of beer (355 mL), one 5 oz glass of wine (148 mL), or one 1 oz glass of hard liquor (44 mL). General instructions  Schedule regular health, dental, and eye exams.  Stay current with your vaccines.  Tell your health care provider if: ? You often feel depressed. ? You have ever been abused or do not feel safe at home. Summary  Adopting a healthy lifestyle and getting preventive care are important in promoting health and  wellness.  Follow your health care provider's instructions about healthy diet, exercising, and getting tested or screened for diseases.  Follow your health care provider's instructions on monitoring your cholesterol and blood pressure. This information is not intended to replace advice given to you by your health care provider. Make sure you discuss any questions you have with your health care provider. Document Revised: 02/18/2018 Document Reviewed: 02/18/2018 Elsevier Patient Education  2020 [employer].

## 2020-01-31 ENCOUNTER — Encounter: Payer: Self-pay | Admitting: Physician Assistant

## 2020-03-06 ENCOUNTER — Telehealth (INDEPENDENT_AMBULATORY_CARE_PROVIDER_SITE_OTHER): Payer: 59 | Admitting: Physician Assistant

## 2020-03-06 ENCOUNTER — Encounter: Payer: Self-pay | Admitting: Physician Assistant

## 2020-03-06 VITALS — Temp 101.0°F | Ht 63.0 in | Wt 205.0 lb

## 2020-03-06 DIAGNOSIS — R6889 Other general symptoms and signs: Secondary | ICD-10-CM

## 2020-03-06 DIAGNOSIS — R509 Fever, unspecified: Secondary | ICD-10-CM

## 2020-03-06 LAB — POCT INFLUENZA A/B
Influenza A, POC: NEGATIVE
Influenza B, POC: NEGATIVE

## 2020-03-06 NOTE — Progress Notes (Signed)
Patient ID: Olivia Miller, female   DOB: February 07, 1961, 59 y.o.   MRN: [id number] .Marland KitchenVirtual Visit via Telephone Note  I connected with Olivia Miller on 03/06/20 at  9:30 AM EST by telephone and verified that I am speaking with the correct person using two identifiers.  Location: Patient: home Provider: clinic  .Marland KitchenParticipating in visit:  Patient: Olivia Miller Provider: Iran Planas PA-C   I discussed the limitations, risks, security and privacy concerns of performing an evaluation and management service by telephone and the availability of in person appointments. I also discussed with the patient that there may be a patient responsible charge related to this service. The patient expressed understanding and agreed to proceed.   History of Present Illness: Pt is a 59 yo female who calls into the clinic with fever, body aches, chills, cough that started yesterday. She has been taking tylenol for fever which does help. She denies any ST, SOB, vomiting, nausea. She did have a few loose stools yesterday. She took theraflu and heart rate went very high. She stopped it and heart rate went down. No other known sick contacts. She does not have flu or covid shot.   .. Active Ambulatory Problems    Diagnosis Date Noted  . Dyslipidemia 03/03/2015  . Tachycardia 03/03/2015  . Left cervical radiculopathy 03/03/2015  . OAB (overactive bladder) 03/03/2015  . DDD (degenerative disc disease), cervical 03/03/2015  . Calcification of right carotid artery 06/07/2015  . Snoring 04/02/2016  . Apneic episode 04/02/2016  . Non-restorative sleep 04/02/2016  . Clostridium difficile colitis 04/18/2016  . Breast mass, right 04/23/2016  . Cervical polyp 08/06/2016  . Tinea pedis of left foot 08/06/2016  . Fibromyalgia 03/10/2017  . Nasal congestion 03/10/2017  . Palpitations 03/10/2017  . Esophageal dysphagia 03/10/2017  . Class 2 severe obesity due to excess calories with serious comorbidity and body mass  index (BMI) of 35.0 to 35.9 in adult ([place]) 03/12/2017  . Family history of bone cancer 03/12/2017  . Chronic bilateral thoracic back pain 03/12/2017  . Chronic bilateral low back pain without sciatica 03/12/2017  . Female cystocele 02/04/2018  . Rectocele 02/04/2018  . Chronic pain of both knees 02/04/2018  . Primary osteoarthritis of knee 02/04/2018  . Plantar fasciitis of right foot 02/16/2018  . Irritant contact dermatitis due to detergent 07/15/2018  . Onychomycosis 07/15/2018  . Vitamin D insufficiency 07/15/2018  . Cough 03/25/2019  . Gastroesophageal reflux disease 03/25/2019  . Wheezing 03/25/2019  . Chronic rhinitis 03/25/2019  . Neck pain 04/05/2019  . Elevated blood pressure reading 04/05/2019  . Pre-diabetes 04/05/2019  . Atypical chest pain 04/07/2019  . Hiatal hernia 04/12/2011   Resolved Ambulatory Problems    Diagnosis Date Noted  . No Resolved Ambulatory Problems   Past Medical History:  Diagnosis Date  . GERD (gastroesophageal reflux disease)   . Hyperlipidemia    Reviewed med, allergy, problem list.      Observations/Objective: No acute distress Dry cough Normal breathing.    .. Today's Vitals   03/06/20 0920  Temp: (!) 101 F (38.3 C)  TempSrc: Oral  Weight: 205 lb (93 kg)  Height: 5\' 3"  (1.6 m)   Body mass index is 36.31 kg/m.     Assessment and Plan: Marland KitchenMarland KitchenDulcemaria was seen today for sinusitis.  Diagnoses and all orders for this visit:  Influenza-like symptoms  Fever, unspecified fever cause   Pt will come to clinic for flu and covid testing. She does not have her covid or flu  shot.  If positive for flu will treat with tamiflu and treat her husband preventively.  If positive for covid will quarantine and symptomatic care with:  Vitamin D, C and zinc.  Watch for any worsening breathing. Keep good deep breaths and walking regularly.    Follow Up Instructions:    I discussed the assessment and treatment plan with the patient.  The patient was provided an opportunity to ask questions and all were answered. The patient agreed with the plan and demonstrated an understanding of the instructions.   The patient was advised to call back or seek an in-person evaluation if the symptoms worsen or if the condition fails to improve as anticipated.  I provided 15 inutes of non-face-to-face time during this encounter.   Tandy Gaw, PA-C

## 2020-03-06 NOTE — Progress Notes (Signed)
Negative for flu.   [name].Vitamin D3 5000 IU (125 mcg) daily Vitamin C 500 mg twice daily Zinc 50 to 75 mg daily   Start these. Quarantine until test results. Watch breathing. Keep walking. Rest and hydrate. Call with any worrisome symptoms.

## 2020-03-08 ENCOUNTER — Telehealth: Payer: Self-pay | Admitting: Physician Assistant

## 2020-03-08 LAB — NOVEL CORONAVIRUS, NAA: SARS-CoV-2, NAA: DETECTED — AB

## 2020-03-08 LAB — SARS-COV-2, NAA 2 DAY TAT

## 2020-03-08 NOTE — Telephone Encounter (Signed)
She does have sinusitis from covid. That is a common symptoms with of covid with lots of head pressure/headache and sinus pressure. Antibiotics are not indicated for a viral infection unless symptoms continue longer than 7-14 days. She needs to consider regular mucinex( no decongestant due to increase in HR). Ibuprofen and tylenol alternating came help with pain/pressure. Lots of fluid and the covid vitamin protocol already sent. Call with any changes in breathing.

## 2020-03-08 NOTE — Progress Notes (Signed)
You are positive for COVID 19. This is a virus.   Please quarantine for 10 days from symptoms.   [name].Vitamin D3 5000 IU (125 mcg) daily Vitamin C 500 mg twice daily Zinc 50 to 75 mg daily    Person Under Monitoring Name: Olivia Miller  Location: [place] [address]   Infection Prevention Recommendations for Individuals Confirmed to have, or Being Evaluated for, 2019 Novel Coronavirus (COVID-19) Infection Who Receive Care at Home  Individuals who are confirmed to have, or are being evaluated for, COVID-19 should follow the prevention steps below until a healthcare provider or local or state health department says they can return to normal activities.  Stay home except to get medical care You should restrict activities outside your home, except for getting medical care. Do not go to work, school, or public areas, and do not use public transportation or taxis.  Call ahead before visiting your doctor Before your medical appointment, call the healthcare provider and tell them that you have, or are being evaluated for, COVID-19 infection. This will help the healthcare provider's office take steps to keep other people from getting infected. Ask your healthcare provider to call the local or state health department.  Monitor your symptoms Seek prompt medical attention if your illness is worsening (e.g., difficulty breathing). Before going to your medical appointment, call the healthcare provider and tell them that you have, or are being evaluated for, COVID-19 infection. Ask your healthcare provider to call the local or state health department.  Wear a facemask You should wear a facemask that covers your nose and mouth when you are in the same room with other people and when you visit a healthcare provider. People who live with or visit you should also wear a facemask while they are in the same room with you.  Separate yourself from other people in your home As much  as possible, you should stay in a different room from other people in your home. Also, you should use a separate bathroom, if available.  Avoid sharing household items You should not share dishes, drinking glasses, cups, eating utensils, towels, bedding, or other items with other people in your home. After using these items, you should wash them thoroughly with soap and water.  Cover your coughs and sneezes Cover your mouth and nose with a tissue when you cough or sneeze, or you can cough or sneeze into your sleeve. Throw used tissues in a lined trash can, and immediately wash your hands with soap and water for at least 20 seconds or use an alcohol-based hand rub.  Wash your [facility] your hands often and thoroughly with soap and water for at least 20 seconds. You can use an alcohol-based hand sanitizer if soap and water are not available and if your hands are not visibly dirty. Avoid touching your eyes, nose, and mouth with unwashed hands.   Prevention Steps for Caregivers and Household Members of Individuals Confirmed to have, or Being Evaluated for, COVID-19 Infection Being Cared for in the Home  If you live with, or provide care at home for, a person confirmed to have, or being evaluated for, COVID-19 infection please follow these guidelines to prevent infection:  Follow healthcare provider's instructions Make sure that you understand and can help the patient follow any healthcare provider instructions for all care.  Provide for the patient's basic needs You should help the patient with basic needs in the home and provide support for getting groceries, prescriptions, and  other personal needs.  Monitor the patient's symptoms If they are getting sicker, call his or her medical provider and tell them that the patient has, or is being evaluated for, COVID-19 infection. This will help the healthcare provider's office take steps to keep other people from getting infected. Ask the  healthcare provider to call the local or state health department.  Limit the number of people who have contact with the patient If possible, have only one caregiver for the patient. Other household members should stay in another home or place of residence. If this is not possible, they should stay in another room, or be separated from the patient as much as possible. Use a separate bathroom, if available. Restrict visitors who do not have an essential need to be in the home.  Keep older adults, very young children, and other sick people away from the patient Keep older adults, very young children, and those who have compromised immune systems or chronic health conditions away from the patient. This includes people with chronic heart, lung, or kidney conditions, diabetes, and cancer.  Ensure good ventilation Make sure that shared spaces in the home have good air flow, such as from an air conditioner or an opened window, weather permitting.  Wash your hands often Wash your hands often and thoroughly with soap and water for at least 20 seconds. You can use an alcohol based hand sanitizer if soap and water are not available and if your hands are not visibly dirty. Avoid touching your eyes, nose, and mouth with unwashed hands. Use disposable paper towels to dry your hands. If not available, use dedicated cloth towels and replace them when they become wet.  Wear a facemask and gloves Wear a disposable facemask at all times in the room and gloves when you touch or have contact with the patient's blood, body fluids, and/or secretions or excretions, such as sweat, saliva, sputum, nasal mucus, vomit, urine, or feces.  Ensure the mask fits over your nose and mouth tightly, and do not touch it during use. Throw out disposable facemasks and gloves after using them. Do not reuse. Wash your hands immediately after removing your facemask and gloves. If your personal clothing becomes contaminated, carefully  remove clothing and launder. Wash your hands after handling contaminated clothing. Place all used disposable facemasks, gloves, and other waste in a lined container before disposing them with other household waste. Remove gloves and wash your hands immediately after handling these items.  Do not share dishes, glasses, or other household items with the patient Avoid sharing household items. You should not share dishes, drinking glasses, cups, eating utensils, towels, bedding, or other items with a patient who is confirmed to have, or being evaluated for, COVID-19 infection. After the person uses these items, you should wash them thoroughly with soap and water.  Wash laundry thoroughly Immediately remove and wash clothes or bedding that have blood, body fluids, and/or secretions or excretions, such as sweat, saliva, sputum, nasal mucus, vomit, urine, or feces, on them. Wear gloves when handling laundry from the patient. Read and follow directions on labels of laundry or clothing items and detergent. In general, wash and dry with the warmest temperatures recommended on the label.  Clean all areas the individual has used often Clean all touchable surfaces, such as counters, tabletops, doorknobs, bathroom fixtures, toilets, phones, keyboards, tablets, and bedside tables, every day. Also, clean any surfaces that may have blood, body fluids, and/or secretions or excretions on them. Wear gloves when cleaning  surfaces the patient has come in contact with. Use a diluted bleach solution (e.g., dilute bleach with 1 part bleach and 10 parts water) or a household disinfectant with a label that says EPA-registered for coronaviruses. To make a bleach solution at home, add 1 tablespoon of bleach to 1 quart (4 cups) of water. For a larger supply, add  cup of bleach to 1 gallon (16 cups) of water. Read labels of cleaning products and follow recommendations provided on product labels. Labels contain instructions for  safe and effective use of the cleaning product including precautions you should take when applying the product, such as wearing gloves or eye protection and making sure you have good ventilation during use of the product. Remove gloves and wash hands immediately after cleaning.  Monitor yourself for signs and symptoms of illness Caregivers and household members are considered close contacts, should monitor their health, and will be asked to limit movement outside of the home to the extent possible. Follow the monitoring steps for close contacts listed on the symptom monitoring form.   ? If you have additional questions, contact your local health department or call the epidemiologist on call at [phone] (available 24/7). ? This guidance is subject to change. For the most up-to-date guidance from [facility], please refer to their website: [email or web address]

## 2020-03-08 NOTE — Telephone Encounter (Signed)
Called to let patient know she was COVID positive.  Patient wanted to know if you could call something into the pharmacy for sinus infection.  Patient woke up with head congestion, headache, pressure around her eyes and cheek bone. Please call into the [facility] pharmacy.

## 2020-03-08 NOTE — Telephone Encounter (Signed)
Advised patient of recommendations per Jade: She does have sinusitis from covid. That is a common symptoms with of covid with lots of head pressure/headache and sinus pressure. Antibiotics are not indicated for a viral infection unless symptoms continue longer than 7-14 days. She needs to consider regular mucinex( no decongestant due to increase in HR). Ibuprofen and tylenol alternating came help with pain/pressure. Lots of fluid and the covid vitamin protocol already sent. Call with any changes in breathing.   Pt states she has the vitamins and will go to drive-thru for Mucinex.

## 2020-03-09 ENCOUNTER — Telehealth: Payer: Self-pay

## 2020-03-09 NOTE — Telephone Encounter (Signed)
Patient lvm stating both she and her husband are COVID +. She would like to know what steps to take concerning her husbands care with the feeding tube. Should he be taking vitamins via the tube as well. What medications should he be taking? Please advise.

## 2020-03-13 NOTE — Telephone Encounter (Signed)
No need for re-testing as per CDC some people remain positive via PCR for 3 months. If asymptomatic ok to go back to work. Ok to provide work note if needed.

## 2020-03-13 NOTE — Telephone Encounter (Signed)
They're doing great. Today is 10th quarantine day. Would like to get tested to be cleared for work? Can she schedule for both to be tested?

## 2020-03-13 NOTE — Telephone Encounter (Signed)
Check on patient and see how the husband and wife are doing. You are not supposed to put whole vitamins in the feeding tube. Any liquid vitamins can be place in tube.

## 2020-03-13 NOTE — Telephone Encounter (Signed)
LVM advising patient of notes per Jade.

## 2020-03-14 ENCOUNTER — Ambulatory Visit (HOSPITAL_BASED_OUTPATIENT_CLINIC_OR_DEPARTMENT_OTHER): Payer: 59 | Admitting: Internal Medicine

## 2020-03-15 ENCOUNTER — Ambulatory Visit: Payer: 59 | Admitting: Physician Assistant

## 2020-03-17 ENCOUNTER — Ambulatory Visit: Payer: 59 | Admitting: Physician Assistant

## 2020-03-20 ENCOUNTER — Encounter: Payer: Self-pay | Admitting: Physician Assistant

## 2020-03-20 ENCOUNTER — Ambulatory Visit (INDEPENDENT_AMBULATORY_CARE_PROVIDER_SITE_OTHER): Payer: 59

## 2020-03-20 ENCOUNTER — Ambulatory Visit: Payer: 59 | Admitting: Physician Assistant

## 2020-03-20 ENCOUNTER — Ambulatory Visit (INDEPENDENT_AMBULATORY_CARE_PROVIDER_SITE_OTHER): Payer: 59 | Admitting: Physician Assistant

## 2020-03-20 ENCOUNTER — Other Ambulatory Visit: Payer: Self-pay

## 2020-03-20 VITALS — BP 133/77 | HR 99 | Temp 98.4°F | Wt 206.0 lb

## 2020-03-20 DIAGNOSIS — U071 COVID-19: Secondary | ICD-10-CM

## 2020-03-20 DIAGNOSIS — J4541 Moderate persistent asthma with (acute) exacerbation: Secondary | ICD-10-CM

## 2020-03-20 DIAGNOSIS — R053 Chronic cough: Secondary | ICD-10-CM | POA: Diagnosis not present

## 2020-03-20 DIAGNOSIS — R059 Cough, unspecified: Secondary | ICD-10-CM

## 2020-03-20 MED ORDER — ATORVASTATIN CALCIUM 20 MG PO TABS: 20.0000 mg | ORAL_TABLET | Freq: Every day | ORAL | 3 refills | Status: DC

## 2020-03-20 MED ORDER — VENTOLIN HFA 108 (90 BASE) MCG/ACT IN AERS: INHALATION_SPRAY | RESPIRATORY_TRACT | 1 refills | Status: DC

## 2020-03-20 MED ORDER — DEXAMETHASONE 4 MG PO TABS: 4.0000 mg | ORAL_TABLET | Freq: Two times a day (BID) | ORAL | 0 refills | Status: DC

## 2020-03-20 MED ORDER — BENZONATATE 200 MG PO CAPS: 200.0000 mg | ORAL_CAPSULE | Freq: Two times a day (BID) | ORAL | 0 refills | Status: DC | PRN

## 2020-03-20 NOTE — Progress Notes (Signed)
Subjective:    Patient ID: Olivia Miller, female    DOB: Jul 25, 1960, 60 y.o.   MRN: [id number]  HPI  Pt is a 60 yo female with asthma who presents to the clinic on Day 11 of covid. She continues to have cough and wheezing. She is using her albuterol inhaler. No fever, chills, body aches. She feels overall better just her breathing.   .. Active Ambulatory Problems    Diagnosis Date Noted  . Dyslipidemia 03/03/2015  . Tachycardia 03/03/2015  . Left cervical radiculopathy 03/03/2015  . OAB (overactive bladder) 03/03/2015  . DDD (degenerative disc disease), cervical 03/03/2015  . Calcification of right carotid artery 06/07/2015  . Snoring 04/02/2016  . Apneic episode 04/02/2016  . Non-restorative sleep 04/02/2016  . Clostridium difficile colitis 04/18/2016  . Breast mass, right 04/23/2016  . Cervical polyp 08/06/2016  . Tinea pedis of left foot 08/06/2016  . Fibromyalgia 03/10/2017  . Nasal congestion 03/10/2017  . Palpitations 03/10/2017  . Esophageal dysphagia 03/10/2017  . Class 2 severe obesity due to excess calories with serious comorbidity and body mass index (BMI) of 35.0 to 35.9 in adult ([place]) 03/12/2017  . Family history of bone cancer 03/12/2017  . Chronic bilateral thoracic back pain 03/12/2017  . Chronic bilateral low back pain without sciatica 03/12/2017  . Female cystocele 02/04/2018  . Rectocele 02/04/2018  . Chronic pain of both knees 02/04/2018  . Primary osteoarthritis of knee 02/04/2018  . Plantar fasciitis of right foot 02/16/2018  . Irritant contact dermatitis due to detergent 07/15/2018  . Onychomycosis 07/15/2018  . Vitamin D insufficiency 07/15/2018  . Cough 03/25/2019  . Gastroesophageal reflux disease 03/25/2019  . Wheezing 03/25/2019  . Chronic rhinitis 03/25/2019  . Neck pain 04/05/2019  . Elevated blood pressure reading 04/05/2019  . Pre-diabetes 04/05/2019  . Atypical chest pain 04/07/2019  . Hiatal hernia 04/12/2011  . Asthmatic  bronchitis 03/21/2020  . COVID-19 virus infection 03/21/2020   Resolved Ambulatory Problems    Diagnosis Date Noted  . No Resolved Ambulatory Problems   Past Medical History:  Diagnosis Date  . GERD (gastroesophageal reflux disease)   . Hyperlipidemia      Review of Systems See HPI.     Objective:   Physical Exam Vitals reviewed.  Constitutional:      Appearance: Normal appearance. She is obese.  Cardiovascular:     Rate and Rhythm: Normal rate and regular rhythm.     Pulses: Normal pulses.  Pulmonary:     Effort: Pulmonary effort is normal.     Breath sounds: Normal breath sounds. No wheezing or rhonchi.  Neurological:     General: No focal deficit present.     Mental Status: She is alert and oriented to person, place, and time.  Psychiatric:        Mood and Affect: Mood normal.        Behavior: Behavior normal.           Assessment & Plan:  Marland KitchenMarland KitchenDiagnoses and all orders for this visit:  Moderate persistent asthmatic bronchitis with acute exacerbation  Persistent cough -     DG Chest 2 View  COVID-19 virus infection  Other orders -     dexamethasone (DECADRON) 4 MG tablet; Take 1 tablet (4 mg total) by mouth 2 (two) times daily with a meal. -     benzonatate (TESSALON) 200 MG capsule; Take 1 capsule (200 mg total) by mouth 2 (two) times daily as needed for cough. -  VENTOLIN HFA 108 (90 Base) MCG/ACT inhaler; SMARTSIG:1-2 Puff(s) By Mouth Every 4 Hours PRN -     atorvastatin (LIPITOR) 20 MG tablet; Take 1 tablet (20 mg total) by mouth daily.   Confirmed positive for Covid she is Day 12.  Vitals stable today.  Persistent cough-CXR confirms bronchitis. No signs of pneumonia.  Decadron/albuterol/tessalon sent to pharmacy.  Follow up as needed.

## 2020-03-20 NOTE — Patient Instructions (Signed)

## 2020-03-20 NOTE — Progress Notes (Signed)
No signs of pneumonia. Do have some signs of bronchitis or asthma exacerbation. Dexamethasone alone is appropriate.

## 2020-03-21 ENCOUNTER — Encounter: Payer: Self-pay | Admitting: Physician Assistant

## 2020-03-21 DIAGNOSIS — U071 COVID-19: Secondary | ICD-10-CM | POA: Insufficient documentation

## 2020-03-21 DIAGNOSIS — J45909 Unspecified asthma, uncomplicated: Secondary | ICD-10-CM | POA: Insufficient documentation

## 2020-03-22 ENCOUNTER — Encounter: Payer: Self-pay | Admitting: Physician Assistant

## 2020-04-03 ENCOUNTER — Other Ambulatory Visit: Payer: Self-pay | Admitting: Physician Assistant

## 2020-04-03 DIAGNOSIS — K219 Gastro-esophageal reflux disease without esophagitis: Secondary | ICD-10-CM

## 2020-04-03 DIAGNOSIS — R053 Chronic cough: Secondary | ICD-10-CM

## 2020-11-10 ENCOUNTER — Encounter: Payer: Self-pay | Admitting: Physician Assistant

## 2020-11-10 ENCOUNTER — Ambulatory Visit (INDEPENDENT_AMBULATORY_CARE_PROVIDER_SITE_OTHER): Payer: 59 | Admitting: Physician Assistant

## 2020-11-10 ENCOUNTER — Other Ambulatory Visit: Payer: Self-pay

## 2020-11-10 ENCOUNTER — Ambulatory Visit (INDEPENDENT_AMBULATORY_CARE_PROVIDER_SITE_OTHER): Payer: 59

## 2020-11-10 VITALS — BP 141/84 | HR 86 | Ht 63.0 in | Wt 202.0 lb

## 2020-11-10 DIAGNOSIS — R7303 Prediabetes: Secondary | ICD-10-CM

## 2020-11-10 DIAGNOSIS — R229 Localized swelling, mass and lump, unspecified: Secondary | ICD-10-CM

## 2020-11-10 DIAGNOSIS — Z1329 Encounter for screening for other suspected endocrine disorder: Secondary | ICD-10-CM

## 2020-11-10 DIAGNOSIS — M545 Low back pain, unspecified: Secondary | ICD-10-CM | POA: Diagnosis not present

## 2020-11-10 DIAGNOSIS — Z0000 Encounter for general adult medical examination without abnormal findings: Secondary | ICD-10-CM | POA: Diagnosis not present

## 2020-11-10 DIAGNOSIS — G8929 Other chronic pain: Secondary | ICD-10-CM

## 2020-11-10 DIAGNOSIS — E6609 Other obesity due to excess calories: Secondary | ICD-10-CM

## 2020-11-10 DIAGNOSIS — R030 Elevated blood-pressure reading, without diagnosis of hypertension: Secondary | ICD-10-CM

## 2020-11-10 DIAGNOSIS — E782 Mixed hyperlipidemia: Secondary | ICD-10-CM

## 2020-11-10 DIAGNOSIS — Z6835 Body mass index (BMI) 35.0-35.9, adult: Secondary | ICD-10-CM

## 2020-11-10 MED ORDER — CYCLOBENZAPRINE HCL 5 MG PO TABS
5.0000 mg | ORAL_TABLET | Freq: Three times a day (TID) | ORAL | 1 refills | Status: DC | PRN
Start: 2020-11-10 — End: 2021-09-19

## 2020-11-10 NOTE — Patient Instructions (Addendum)
Tens unit Posture NSAIDs '600mg'$ -'800mg'$  ibuprofen Chiropractor Massage therapy Mikki Santee and Brad you tube  Low Back Sprain or Strain Rehab Ask your health care provider which exercises are safe for you. Do exercises exactly as told by your health care provider and adjust them as directed. It is normal to feel mild stretching, pulling, tightness, or discomfort as you do these exercises. Stop right away if you feel sudden pain or your pain gets worse. Do not begin these exercises until told by your health care provider. Stretching and range-of-motion exercises These exercises warm up your muscles and joints and improve the movement and flexibility of your back. These exercises also help to relieve pain, numbness, and tingling. Lumbar rotation  Lie on your back on a firm bed or the floor with your knees bent. Straighten your arms out to your sides so each arm forms a 90-degree angle (right angle) with a side of your body. Slowly move (rotate) both of your knees to one side of your body until you feel a stretch in your lower back (lumbar). Try not to let your shoulders lift off the floor. Hold this position for __________ seconds. Tense your abdominal muscles and slowly move your knees back to the starting position. Repeat this exercise on the other side of your body. Repeat __________ times. Complete this exercise __________ times a day. Single knee to chest  Lie on your back on a firm bed or the floor with both legs straight. Bend one of your knees. Use your hands to move your knee up toward your chest until you feel a gentle stretch in your lower back and buttock. Hold your leg in this position by holding on to the front of your knee. Keep your other leg as straight as possible. Hold this position for __________ seconds. Slowly return to the starting position. Repeat with your other leg. Repeat __________ times. Complete this exercise __________ times a day. Prone extension on elbows  Lie on  your abdomen on a firm bed or the floor (prone position). Prop yourself up on your elbows. Use your arms to help lift your chest up until you feel a gentle stretch in your abdomen and your lower back. This will place some of your body weight on your elbows. If this is uncomfortable, try stacking pillows under your chest. Your hips should stay down, against the surface that you are lying on. Keep your hip and back muscles relaxed. Hold this position for __________ seconds. Slowly relax your upper body and return to the starting position. Repeat __________ times. Complete this exercise __________ times a day. Strengthening exercises These exercises build strength and endurance in your back. Endurance is the ability to use your muscles for a long time, even after they get tired. Pelvic tilt This exercise strengthens the muscles that lie deep in the abdomen. Lie on your back on a firm bed or the floor with your legs extended. Bend your knees so they are pointing toward the ceiling and your feet are flat on the floor. Tighten your lower abdominal muscles to press your lower back against the floor. This motion will tilt your pelvis so your tailbone points up toward the ceiling instead of pointing to your feet or the floor. To help with this exercise, you may place a small towel under your lower back and try to push your back into the towel. Hold this position for __________ seconds. Let your muscles relax completely before you repeat this exercise. Repeat __________ times. Complete this  exercise __________ times a day. Alternating arm and leg raises  Get on your hands and knees on a firm surface. If you are on a hard floor, you may want to use padding, such as an exercise mat, to cushion your knees. Line up your arms and legs. Your hands should be directly below your shoulders, and your knees should be directly below your hips. Lift your left leg behind you. At the same time, raise your right arm  and straighten it in front of you. Do not lift your leg higher than your hip. Do not lift your arm higher than your shoulder. Keep your abdominal and back muscles tight. Keep your hips facing the ground. Do not arch your back. Keep your balance carefully, and do not hold your breath. Hold this position for __________ seconds. Slowly return to the starting position. Repeat with your right leg and your left arm. Repeat __________ times. Complete this exercise __________ times a day. Abdominal set with straight leg raise  Lie on your back on a firm bed or the floor. Bend one of your knees and keep your other leg straight. Tense your abdominal muscles and lift your straight leg up, 4-6 inches (10-15 cm) off the ground. Keep your abdominal muscles tight and hold this position for __________ seconds. Do not hold your breath. Do not arch your back. Keep it flat against the ground. Keep your abdominal muscles tense as you slowly lower your leg back to the starting position. Repeat with your other leg. Repeat __________ times. Complete this exercise __________ times a day. Single leg lower with bent knees Lie on your back on a firm bed or the floor. Tense your abdominal muscles and lift your feet off the floor, one foot at a time, so your knees and hips are bent in 90-degree angles (right angles). Your knees should be over your hips and your lower legs should be parallel to the floor. Keeping your abdominal muscles tense and your knee bent, slowly lower one of your legs so your toe touches the ground. Lift your leg back up to return to the starting position. Do not hold your breath. Do not let your back arch. Keep your back flat against the ground. Repeat with your other leg. Repeat __________ times. Complete this exercise __________ times a day. Posture and body mechanics Good posture and healthy body mechanics can help to relieve stress in your body's tissues and joints. Body mechanics  refers to the movements and positions of your body while you do your daily activities. Posture is part of body mechanics. Good posture means: Your spine is in its natural S-curve position (neutral). Your shoulders are pulled back slightly. Your head is not tipped forward (neutral). Follow these guidelines to improve your posture and body mechanics in your everyday activities. Standing  When standing, keep your spine neutral and your feet about hip-width apart. Keep a slight bend in your knees. Your ears, shoulders, and hips should line up. When you do a task in which you stand in one place for a long time, place one foot up on a stable object that is 2-4 inches (5-10 cm) high, such as a footstool. This helps keep your spine neutral. Sitting  When sitting, keep your spine neutral and keep your feet flat on the floor. Use a footrest, if necessary, and keep your thighs parallel to the floor. Avoid rounding your shoulders, and avoid tilting your head forward. When working at a desk or a Teaching laboratory technician, keep your  desk at a height where your hands are slightly lower than your elbows. Slide your chair under your desk so you are close enough to maintain good posture. When working at a computer, place your monitor at a height where you are looking straight ahead and you do not have to tilt your head forward or downward to look at the screen. Resting When lying down and resting, avoid positions that are most painful for you. If you have pain with activities such as sitting, bending, stooping, or squatting, lie in a position in which your body does not bend very much. For example, avoid curling up on your side with your arms and knees near your chest (fetal position). If you have pain with activities such as standing for a long time or reaching with your arms, lie with your spine in a neutral position and bend your knees slightly. Try the following positions: Lying on your side with a pillow between your  knees. Lying on your back with a pillow under your knees. Lifting  When lifting objects, keep your feet at least shoulder-width apart and tighten your abdominal muscles. Bend your knees and hips and keep your spine neutral. It is important to lift using the strength of your legs, not your back. Do not lock your knees straight out. Always ask for help to lift heavy or awkward objects. This information is not intended to replace advice given to you by your health care provider. Make sure you discuss any questions you have with your health care provider. Document Revised: 05/15/2020 Document Reviewed: 05/15/2020 Elsevier Patient Education  2022 [place]. Lipoma A lipoma is a noncancerous (benign) tumor that is made up of fat cells. This is a very common type of soft-tissue growth. Lipomas are usually found under the skin (subcutaneous). They may occur in any tissue of the body that contains fat. Common areas for lipomas to appear include the back, arms, shoulders, buttocks, and thighs. Lipomas grow slowly, and they are usually painless. Most lipomas do not cause problems and do not require treatment. What are the causes? The cause of this condition is not known. What increases the risk? You are more likely to develop this condition if: You are 85-66 years old. You have a family history of lipomas. What are the signs or symptoms? A lipoma usually appears as a small, round bump under the skin. In most cases, the lump will: Feel soft or rubbery. Not cause pain or other symptoms. However, if a lipoma is located in an area where it pushes on nerves, it can become painful or cause other symptoms. How is this diagnosed? A lipoma can usually be diagnosed with a physical exam. You may also have tests to confirm the diagnosis and to rule out other conditions. Tests may include: Imaging tests, such as a CT scan or an MRI. Removal of a tissue sample to be looked at under a microscope (biopsy). How  is this treated? Treatment for this condition depends on the size of the lipoma and whether it is causing any symptoms. For small lipomas that are not causing problems, no treatment is needed. If a lipoma is bigger or it causes problems, surgery may be done to remove the lipoma. Lipomas can also be removed to improve appearance. Most often, the procedure is done after applying a medicine that numbs the area (local anesthetic). Liposuction may be done to reduce the size of the lipoma before it is removed through surgery, or it may be done to  remove the lipoma. Lipomas are removed with this method in order to limit incision size and scarring. A liposuction tube is inserted through a small incision into the lipoma, and the contents of the lipoma are removed through the tube with suction. Follow these instructions at home: Watch your lipoma for any changes. Keep all follow-up visits as told by your health care provider. This is important. Contact a health care provider if: Your lipoma becomes larger or hard. Your lipoma becomes painful, red, or increasingly swollen. These could be signs of infection or a more serious condition. Get help right away if: You develop tingling or numbness in an area near the lipoma. This could indicate that the lipoma is causing nerve damage. Summary A lipoma is a noncancerous tumor that is made up of fat cells. Most lipomas do not cause problems and do not require treatment. If a lipoma is bigger or it causes problems, surgery may be done to remove the lipoma. Contact a health care provider if your lipoma becomes larger or hard, or if it becomes painful, red, or increasingly swollen. Pain, redness, and swelling could be signs of infection or a more serious condition. This information is not intended to replace advice given to you by your health care provider. Make sure you discuss any questions you have with your health care provider. Document Revised: 10/12/2018 Document  Reviewed: 10/12/2018 Elsevier Patient Education  [place].

## 2020-11-10 NOTE — Progress Notes (Signed)
Subjective:    Patient ID: Olivia Miller, female    DOB: [birth year], 60 y.o.   MRN: [id number]  HPI Pt is a 60 yo obese female who presents to the clinic for follow up.   Pt has some concerns today.   Would like referral to help lose weight without medication at [facility].   Pt does have some painless mobile nodules of bilateral forearm. She would like these evaluated. Noticed for a few weeks. At times seem like they are getting bigger.   Pt is also concern with worsening low back pain. She has had off and on low back pain for years but worsening. No new injury or trauma. She can feel it when she sits for too long. Seems to radiate up into back instead of down into legs. No bowel or bladder dysfunction, saddle anesthesia, leg weakness. Harder to be active.      .. Active Ambulatory Problems    Diagnosis Date Noted   Dyslipidemia 03/03/2015   Tachycardia 03/03/2015   Left cervical radiculopathy 03/03/2015   OAB (overactive bladder) 03/03/2015   DDD (degenerative disc disease), cervical 03/03/2015   Calcification of right carotid artery 06/07/2015   Snoring 04/02/2016   Apneic episode 04/02/2016   Non-restorative sleep 04/02/2016   Clostridium difficile colitis 04/18/2016   Breast mass, right 04/23/2016   Cervical polyp 08/06/2016   Tinea pedis of left foot 08/06/2016   Fibromyalgia 03/10/2017   Nasal congestion 03/10/2017   Palpitations 03/10/2017   Esophageal dysphagia 03/10/2017   Class 2 obesity due to excess calories without serious comorbidity with body mass index (BMI) of 35.0 to 35.9 in adult 03/12/2017   Family history of bone cancer 03/12/2017   Chronic bilateral thoracic back pain 03/12/2017   Chronic bilateral low back pain without sciatica 03/12/2017   Female cystocele 02/04/2018   Rectocele 02/04/2018   Chronic pain of both knees 02/04/2018   Primary osteoarthritis of knee 02/04/2018   Plantar fasciitis of right foot 02/16/2018    Irritant contact dermatitis due to detergent 07/15/2018   Onychomycosis 07/15/2018   Vitamin D insufficiency 07/15/2018   Cough 03/25/2019   Gastroesophageal reflux disease 03/25/2019   Wheezing 03/25/2019   Chronic rhinitis 03/25/2019   Neck pain 04/05/2019   Elevated blood pressure reading 04/05/2019   Pre-diabetes 04/05/2019   Atypical chest pain 04/07/2019   Hiatal hernia 04/12/2011   Asthmatic bronchitis 03/21/2020   COVID-19 virus infection 03/21/2020   DDD (degenerative disc disease), lumbar 11/14/2020   Multiple skin nodules 11/15/2020   Resolved Ambulatory Problems    Diagnosis Date Noted   No Resolved Ambulatory Problems   Past Medical History:  Diagnosis Date   GERD (gastroesophageal reflux disease)    Hyperlipidemia      Review of Systems    See HPI.  Objective:   Physical Exam Vitals reviewed.  Constitutional:      Appearance: Normal appearance. She is obese.  HENT:     Head: Normocephalic.  Cardiovascular:     Rate and Rhythm: Normal rate and regular rhythm.     Pulses: Normal pulses.  Pulmonary:     Effort: Pulmonary effort is normal.     Breath sounds: Normal breath sounds.  Musculoskeletal:        General: No swelling, tenderness or signs of injury.     Right lower leg: No edema.     Left lower leg: No edema.     Comments: No tenderness to lumbar palpation of spine.  5/5 strength of lower extremity.  Negative straight leg test.  Tight paraspinal muscles of lumbar spine.  Pain with flexion at waist.   Skin:    Comments: Mobile, non tender nodules of bilateral forearms. More notable in the left.   Neurological:     General: No focal deficit present.     Mental Status: She is alert and oriented to person, place, and time.  Psychiatric:        Mood and Affect: Mood normal.          Assessment & Plan:  Marland KitchenMarland KitchenMyrtha was seen today for follow-up.  Diagnoses and all orders for this visit:  Chronic bilateral low back pain without sciatica -      cyclobenzaprine (FLEXERIL) 5 MG tablet; Take 1 tablet (5 mg total) by mouth 3 (three) times daily as needed for muscle spasms. -     DG Lumbar Spine Complete; Future  Mixed hyperlipidemia -     Lipid Panel w/reflex Direct LDL -     Amb Ref to Medical Weight Management  Pre-diabetes -     COMPLETE METABOLIC PANEL WITH GFR -     Hemoglobin A1c -     Amb Ref to Medical Weight Management  Routine physical examination -     TSH -     Lipid Panel w/reflex Direct LDL -     COMPLETE METABOLIC PANEL WITH GFR -     CBC with Differential/Platelet -     Hemoglobin A1c  Elevated blood pressure reading -     COMPLETE METABOLIC PANEL WITH GFR  Thyroid disorder screen -     TSH  Multiple skin nodules -     US SOFT TISSUE UPPER EXTREMITY LIMITED LEFT (NON-VASCULAR); Future  Class 2 obesity due to excess calories without serious comorbidity with body mass index (BMI) of 35.0 to 35.9 in adult -     Amb Ref to Medical Weight Management   Pt requested referral to [facility] wellness. Placed today.   Needs screening labs.   Suspect lipomas of arms. Will get u/s to evaluation. Discussed lipomas with patient.   Discussed chronic low back pain. No red flags.today. likely degenerative changes.  Get xrays.  Discussed conservative care with tens unit, posture, NSAIDS, chiropractor, massage therapy, exercises.  Given exercises.  Follow up with Dr. Darene Lamer for further management.   Discussed vaccines. Pt declined covid, shingles, pneumonia, flu and Tdap. Pt aware of risk.

## 2020-11-14 ENCOUNTER — Encounter: Payer: Self-pay | Admitting: Physician Assistant

## 2020-11-14 DIAGNOSIS — M5136 Other intervertebral disc degeneration, lumbar region: Secondary | ICD-10-CM | POA: Insufficient documentation

## 2020-11-14 NOTE — Progress Notes (Signed)
Olivia Miller,   You do have a slippage of disc L4/5 and some facet joint changes. I do recommend you see Dr. Darene Lamer to come up for a plan in management.

## 2020-11-15 ENCOUNTER — Encounter: Payer: Self-pay | Admitting: Physician Assistant

## 2020-11-15 DIAGNOSIS — R229 Localized swelling, mass and lump, unspecified: Secondary | ICD-10-CM | POA: Insufficient documentation

## 2020-11-17 ENCOUNTER — Ambulatory Visit (INDEPENDENT_AMBULATORY_CARE_PROVIDER_SITE_OTHER): Payer: 59

## 2020-11-17 ENCOUNTER — Other Ambulatory Visit: Payer: Self-pay

## 2020-11-17 DIAGNOSIS — R229 Localized swelling, mass and lump, unspecified: Secondary | ICD-10-CM

## 2020-11-17 DIAGNOSIS — R2232 Localized swelling, mass and lump, left upper limb: Secondary | ICD-10-CM

## 2020-11-20 NOTE — Progress Notes (Signed)
No concerns. Subcutaneous fat.

## 2021-03-11 HISTORY — PX: COLPORRHAPHY: SHX921

## 2021-07-02 ENCOUNTER — Telehealth: Payer: Self-pay | Admitting: General Practice

## 2021-07-02 NOTE — Telephone Encounter (Signed)
Transition Care Management Follow-up Telephone Call ?Date of discharge and from where: 07/01/21 from [facility] ?How have you been since you were released from the hospital? Still having pain. Took her first dose Meloxicam this morning. I offered to make her an appointment but she has to make sure with her insurance company if Luvenia Starch is still covered. She will call them and find out and then make an appointment.  ?Any questions or concerns? No ? ?Items Reviewed: ?Did the pt receive and understand the discharge instructions provided? Yes  ?Medications obtained and verified? Yes  ?Other? No  ?Any new allergies since your discharge? No  ?Dietary orders reviewed? Yes ?Do you have support at home? Yes  ? ?Home Care and Equipment/Supplies: ?Were home health services ordered? no ? ?Functional Questionnaire: (I = Independent and D = Dependent) ?ADLs: I ? ?Bathing/Dressing- I ? ?Meal Prep- I ? ?Eating- I ? ?Maintaining continence- I ? ?Transferring/Ambulation- I ? ?Managing Meds- I ? ?Follow up appointments reviewed: ? ?PCP Hospital f/u appt confirmed? No  Patient has new insurance and she will call her insurance company to make sure our office is covered. ?[facility] f/u appt confirmed? No   ?Are transportation arrangements needed? No  ?If their condition worsens, is the pt aware to call PCP or go to the Emergency Dept.? Yes ?Was the patient provided with contact information for the PCP's office or ED? Yes ?Was to pt encouraged to call back with questions or concerns? Yes  ?

## 2021-08-10 ENCOUNTER — Ambulatory Visit (INDEPENDENT_AMBULATORY_CARE_PROVIDER_SITE_OTHER): Payer: 59 | Admitting: Physician Assistant

## 2021-08-10 ENCOUNTER — Encounter: Payer: Self-pay | Admitting: Physician Assistant

## 2021-08-10 VITALS — BP 131/92 | HR 87 | Ht 63.0 in | Wt 192.0 lb

## 2021-08-10 DIAGNOSIS — M48062 Spinal stenosis, lumbar region with neurogenic claudication: Secondary | ICD-10-CM

## 2021-08-10 DIAGNOSIS — G8929 Other chronic pain: Secondary | ICD-10-CM | POA: Diagnosis not present

## 2021-08-10 DIAGNOSIS — E6609 Other obesity due to excess calories: Secondary | ICD-10-CM | POA: Diagnosis not present

## 2021-08-10 DIAGNOSIS — E782 Mixed hyperlipidemia: Secondary | ICD-10-CM | POA: Diagnosis not present

## 2021-08-10 DIAGNOSIS — Z6834 Body mass index (BMI) 34.0-34.9, adult: Secondary | ICD-10-CM

## 2021-08-10 DIAGNOSIS — Z789 Other specified health status: Secondary | ICD-10-CM | POA: Diagnosis not present

## 2021-08-10 DIAGNOSIS — E66811 Obesity, class 1: Secondary | ICD-10-CM

## 2021-08-10 DIAGNOSIS — M5442 Lumbago with sciatica, left side: Secondary | ICD-10-CM

## 2021-08-10 MED ORDER — PITAVASTATIN CALCIUM 2 MG PO TABS
2.0000 mg | ORAL_TABLET | Freq: Every day | ORAL | 5 refills | Status: DC
Start: 2021-08-10 — End: 2022-05-21

## 2021-08-10 NOTE — Progress Notes (Signed)
Established Patient Office Visit  Subjective   Patient ID: Olivia Miller, female    DOB: 1960-03-15  Age: 61 y.o. MRN: [id number]  Chief Complaint  Patient presents with   Follow-up   Failed crestor lip HPI Pt is a 61 yo obese female who presents to the clinic to follow up after a ED visit and orthopedic visit for acute on chronic left sided low back pain with left sided sciatica due to spinal stenosis. She has had some insurance issue and had to [facility] until they were sorted on but would like to stay with our clinic.   She has had some chronic back pain that suddenly got worse around in April 2023. She went to ED on 4/23 and to orthopedic in May. She does have spinal stenosis. She was treated with prednisone, gabapentin and order formal PT. ESI was discussed but never ordered. She is feeling much better and off gabapentin. She is doing exercises at home. She is working really hard with diet and exercise to lose weight and get healthier.   She did stop statin due to myalgias. She has tried lipitor and crestor.    .. Active Ambulatory Problems    Diagnosis Date Noted   Dyslipidemia 03/03/2015   Tachycardia 03/03/2015   Left cervical radiculopathy 03/03/2015   OAB (overactive bladder) 03/03/2015   DDD (degenerative disc disease), cervical 03/03/2015   Calcification of right carotid artery 06/07/2015   Snoring 04/02/2016   Apneic episode 04/02/2016   Non-restorative sleep 04/02/2016   Clostridium difficile colitis 04/18/2016   Breast mass, right 04/23/2016   Cervical polyp 08/06/2016   Tinea pedis of left foot 08/06/2016   Fibromyalgia 03/10/2017   Nasal congestion 03/10/2017   Palpitations 03/10/2017   Esophageal dysphagia 03/10/2017   Class 2 obesity due to excess calories without serious comorbidity with body mass index (BMI) of 35.0 to 35.9 in adult 03/12/2017   Family history of bone cancer 03/12/2017   Chronic bilateral thoracic back pain 03/12/2017   Chronic  bilateral low back pain with left-sided sciatica 03/12/2017   Female cystocele 02/04/2018   Rectocele 02/04/2018   Chronic pain of both knees 02/04/2018   Primary osteoarthritis of knee 02/04/2018   Plantar fasciitis of right foot 02/16/2018   Irritant contact dermatitis due to detergent 07/15/2018   Onychomycosis 07/15/2018   Vitamin D insufficiency 07/15/2018   Cough 03/25/2019   Gastroesophageal reflux disease 03/25/2019   Wheezing 03/25/2019   Chronic rhinitis 03/25/2019   Neck pain 04/05/2019   Elevated blood pressure reading 04/05/2019   Pre-diabetes 04/05/2019   Atypical chest pain 04/07/2019   Hiatal hernia 04/12/2011   Asthmatic bronchitis 03/21/2020   COVID-19 virus infection 03/21/2020   DDD (degenerative disc disease), lumbar 11/14/2020   Multiple skin nodules 11/15/2020   Statin intolerance 08/13/2021   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   Resolved Ambulatory Problems    Diagnosis Date Noted   No Resolved Ambulatory Problems   Past Medical History:  Diagnosis Date   GERD (gastroesophageal reflux disease)    Hyperlipidemia      ROS   See HPI.  Objective:     BP (!) 131/92   Pulse 87   Ht '5\' 3"'$  (1.6 m)   Wt 192 lb (87.1 kg)   LMP 05/21/2015 (Within Days)   SpO2 99%   BMI 34.01 kg/m  BP Readings from Last 3 Encounters:  08/10/21 (!) 131/92  11/10/20 (!) 141/84  03/20/20 133/77  Wt Readings from Last 3 Encounters:  08/10/21 192 lb (87.1 kg)  11/10/20 202 lb (91.6 kg)  03/20/20 206 lb (93.4 kg)      Physical Exam Vitals reviewed.  Constitutional:      Appearance: Normal appearance. She is obese.  HENT:     Head: Normocephalic.  Neck:     Vascular: No carotid bruit.  Cardiovascular:     Rate and Rhythm: Normal rate and regular rhythm.     Pulses: Normal pulses.     Heart sounds: Normal heart sounds.  Pulmonary:     Effort: Pulmonary effort is normal.     Breath sounds: Normal breath sounds.   Musculoskeletal:     Right lower leg: No edema.     Left lower leg: No edema.     Comments: Lower extremity strength bilateral 5/5.   Lymphadenopathy:     Cervical: No cervical adenopathy.  Neurological:     General: No focal deficit present.     Mental Status: She is alert and oriented to person, place, and time.  Psychiatric:        Mood and Affect: Mood normal.       The 10-year ASCVD risk score (Arnett DK, et al., 2019) is: 9.4%    Assessment & Plan:  Marland KitchenMarland KitchenErial was seen today for follow-up.  Diagnoses and all orders for this visit:  Spinal stenosis of lumbar region with neurogenic claudication  Statin intolerance -     Pitavastatin Calcium 2 MG TABS; Take 1 tablet (2 mg total) by mouth daily.  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   Discussed spinal stenosis and management Continue as needed mobic daily Continue daily exercises and weight loss  Strongly recommend a statin for patient but realize pt is having side effects Trial of livalo due to failure of 2 statins Consider if livalo not approved using statin 3 times a week CV risk is high and questions other screening Encouraged life line screenings   Iran Planas, PA-C

## 2021-08-10 NOTE — Patient Instructions (Addendum)
[facility] Screening  Consider crestor once or twice a week  Spinal Stenosis  Spinal stenosis is a condition that happens when the spinal canal narrows. The spinal canal is the space between the bones of your spine (vertebrae). This narrowing puts pressure on the spinal cord or nerves. Spinal stenosis can affect the vertebrae in the neck, upper back, and lower back. This condition can range from mild to severe. In some cases, there are no symptoms. What are the causes? This condition is caused by areas of bone pushing into the spinal canal. This condition may be present at birth (congenital), or it may be caused by: Slow breakdown of your vertebrae (spinal degeneration). This usually starts around 61 years of age. Injury (trauma) to your spine. Tumors in your spine. Calcium deposits in your spine. What increases the risk? The following factors may make you more likely to develop this condition: Being older than age 25. Having a problem present at birth with an abnormally shaped spine (congenitalspinal deformity), such as scoliosis. Having arthritis. What are the signs or symptoms? Symptoms of this condition include: Pain in the neck or back that is generally worse with activities, particularly when you stand or walk. Numbness, tingling, hot or cold sensations, weakness, or tiredness (fatigue) in your leg or legs. Pain going from the buttock, down the thigh, and to the calf (sciatica). This can happen in one or both legs. Frequent episodes of falling. A foot-slapping gait that leads to muscle weakness. In more severe cases, you may develop: Problems having a bowel movement or urinating. Difficulty having sex. Loss of feeling in your legs and inability to walk. Symptoms may come on slowly and get worse over time. In some cases, there are no symptoms. How is this diagnosed? This condition is diagnosed based on your medical history and a physical exam. You will also have tests, such as an  MRI, a CT scan, or an X-ray. How is this treated? Treatment for this condition often focuses on managing your pain and any other symptoms. Treatment may include: Practicing good posture to lessen pressure on your nerves. Exercising to strengthen muscles, build endurance, improve balance, and maintain range of motion. This may include physical therapy to restore movement and strength to your back. Losing weight, if needed. Medicines to reduce inflammation or pain. This may include a medicine that is injected into your spine (steroidinjection). Assistive devices, such as a corset or brace. In some cases, surgery may be needed. The most common procedure is decompression laminectomy. This is done to remove excess bone that puts pressure on your nerve roots. Follow these instructions at home: Managing pain, stiffness, and swelling  Practice good posture. If you were given a brace or a corset, wear it as told by your health care provider. Maintain a healthy weight. Talk with your health care provider if you need help losing weight. If directed, apply heat to the affected area as often as told by your health care provider. Use the heat source that your health care provider recommends, such as a moist heat pack or a heating pad. Place a towel between your skin and the heat source. Leave the heat on for 20-30 minutes. Remove the heat if your skin turns bright red. This is especially important if you are unable to feel pain, heat, or cold. You may have a greater risk of getting burned. Activity Do all exercises and stretches as told by your health care provider. Do not do any activities that cause  pain. Ask your health care provider what activities are safe for you. Do not lift anything that is heavier than 10 lb (4.5 kg), or the limit that you are told by your health care provider. Return to your normal activities as told by your health care provider. Ask your health care provider what activities are  safe for you. General instructions Take over-the-counter and prescription medicines only as told by your health care provider. Do not use any products that contain nicotine or tobacco, such as cigarettes, e-cigarettes, and chewing tobacco. If you need help quitting, ask your health care provider. Eat a healthy diet. This includes plenty of fruits and vegetables, whole grains, and low-fat (lean) protein. Keep all follow-up visits as told by your health care provider. This is important. Contact a health care provider if: Your symptoms do not get better or they get worse. You have a fever. Get help right away if: You have new pain or symptoms of severe pain, such as: New or worsening pain in your neck or upper back. Severe pain that cannot be controlled with medicines. A severe headache that gets worse when you stand. You are dizzy. You have vision problems, such as blurred vision or double vision. You have nausea or you vomit. You develop new or worsening numbness or tingling in your back or legs. You have pain, redness, swelling, or warmth in your arm or leg. Summary Spinal stenosis is a condition that happens when the spinal canal narrows. The spinal canal is the space between the bones of your spine (vertebrae). This narrowing puts pressure on the spinal cord or nerves. This condition may be caused by a birth defect, breakdown of your vertebrae, trauma, tumors, or calcium deposits. Spinal stenosis can cause numbness, weakness, or pain in the buttocks, neck, back, and legs. This condition is usually diagnosed with your medical history, a physical exam, and tests, such as an MRI, a CT scan, or an X-ray. This information is not intended to replace advice given to you by your health care provider. Make sure you discuss any questions you have with your health care provider. Document Revised: 03/28/2021 Document Reviewed: 12/24/2018 Elsevier Patient Education  [place].

## 2021-08-13 ENCOUNTER — Encounter: Payer: Self-pay | Admitting: Physician Assistant

## 2021-08-13 DIAGNOSIS — M48062 Spinal stenosis, lumbar region with neurogenic claudication: Secondary | ICD-10-CM | POA: Insufficient documentation

## 2021-08-13 DIAGNOSIS — Z789 Other specified health status: Secondary | ICD-10-CM | POA: Insufficient documentation

## 2021-08-13 DIAGNOSIS — E782 Mixed hyperlipidemia: Secondary | ICD-10-CM | POA: Insufficient documentation

## 2021-09-07 ENCOUNTER — Other Ambulatory Visit: Payer: Self-pay | Admitting: Physician Assistant

## 2021-09-07 ENCOUNTER — Telehealth: Payer: Self-pay | Admitting: Neurology

## 2021-09-07 MED ORDER — PREDNISONE 20 MG PO TABS: ORAL_TABLET | ORAL | 0 refills | Status: DC

## 2021-09-07 NOTE — Telephone Encounter (Signed)
Patient scheduled for 09/19/21. AMUCK

## 2021-09-07 NOTE — Telephone Encounter (Signed)
Patient called and left vm stating her pain medication isn't working and she wants steroids for back pain. Looks like when she was seen 1 month ago back pain under control. Will need appt to address. Please call patient to schedule.

## 2021-09-07 NOTE — Telephone Encounter (Signed)
Patient made aware.

## 2021-09-07 NOTE — Telephone Encounter (Signed)
Patient scheduled, she had asked about steroids, do you need to see her first?

## 2021-09-19 ENCOUNTER — Ambulatory Visit (INDEPENDENT_AMBULATORY_CARE_PROVIDER_SITE_OTHER): Payer: 59 | Admitting: Physician Assistant

## 2021-09-19 ENCOUNTER — Encounter: Payer: Self-pay | Admitting: Physician Assistant

## 2021-09-19 VITALS — BP 121/79 | HR 80 | Ht 63.0 in | Wt 191.0 lb

## 2021-09-19 DIAGNOSIS — M545 Low back pain, unspecified: Secondary | ICD-10-CM

## 2021-09-19 DIAGNOSIS — M48062 Spinal stenosis, lumbar region with neurogenic claudication: Secondary | ICD-10-CM | POA: Diagnosis not present

## 2021-09-19 DIAGNOSIS — G8929 Other chronic pain: Secondary | ICD-10-CM | POA: Diagnosis not present

## 2021-09-19 MED ORDER — MELOXICAM 15 MG PO TABS: 15.0000 mg | ORAL_TABLET | Freq: Every day | ORAL | 0 refills | Status: DC

## 2021-09-19 MED ORDER — CYCLOBENZAPRINE HCL 5 MG PO TABS: 5.0000 mg | ORAL_TABLET | Freq: Three times a day (TID) | ORAL | 1 refills | Status: DC | PRN

## 2021-09-19 NOTE — Patient Instructions (Signed)
Low Back Sprain or Strain Rehab Ask your health care provider which exercises are safe for you. Do exercises exactly as told by your health care provider and adjust them as directed. It is normal to feel mild stretching, pulling, tightness, or discomfort as you do these exercises. Stop right away if you feel sudden pain or your pain gets worse. Do not begin these exercises until told by your health care provider. Stretching and range-of-motion exercises These exercises warm up your muscles and joints and improve the movement and flexibility of your back. These exercises also help to relieve pain, numbness, and tingling. Lumbar rotation  Lie on your back on a firm bed or the floor with your knees bent. Straighten your arms out to your sides so each arm forms a 90-degree angle (right angle) with a side of your body. Slowly move (rotate) both of your knees to one side of your body until you feel a stretch in your lower back (lumbar). Try not to let your shoulders lift off the floor. Hold this position for __________ seconds. Tense your abdominal muscles and slowly move your knees back to the starting position. Repeat this exercise on the other side of your body. Repeat __________ times. Complete this exercise __________ times a day. Single knee to chest  Lie on your back on a firm bed or the floor with both legs straight. Bend one of your knees. Use your hands to move your knee up toward your chest until you feel a gentle stretch in your lower back and buttock. Hold your leg in this position by holding on to the front of your knee. Keep your other leg as straight as possible. Hold this position for __________ seconds. Slowly return to the starting position. Repeat with your other leg. Repeat __________ times. Complete this exercise __________ times a day. Prone extension on elbows  Lie on your abdomen on a firm bed or the floor (prone position). Prop yourself up on your elbows. Use your arms  to help lift your chest up until you feel a gentle stretch in your abdomen and your lower back. This will place some of your body weight on your elbows. If this is uncomfortable, try stacking pillows under your chest. Your hips should stay down, against the surface that you are lying on. Keep your hip and back muscles relaxed. Hold this position for __________ seconds. Slowly relax your upper body and return to the starting position. Repeat __________ times. Complete this exercise __________ times a day. Strengthening exercises These exercises build strength and endurance in your back. Endurance is the ability to use your muscles for a long time, even after they get tired. Pelvic tilt This exercise strengthens the muscles that lie deep in the abdomen. Lie on your back on a firm bed or the floor with your legs extended. Bend your knees so they are pointing toward the ceiling and your feet are flat on the floor. Tighten your lower abdominal muscles to press your lower back against the floor. This motion will tilt your pelvis so your tailbone points up toward the ceiling instead of pointing to your feet or the floor. To help with this exercise, you may place a small towel under your lower back and try to push your back into the towel. Hold this position for __________ seconds. Let your muscles relax completely before you repeat this exercise. Repeat __________ times. Complete this exercise __________ times a day. Alternating arm and leg raises  Get on your hands   and knees on a firm surface. If you are on a hard floor, you may want to use padding, such as an exercise mat, to cushion your knees. Line up your arms and legs. Your hands should be directly below your shoulders, and your knees should be directly below your hips. Lift your left leg behind you. At the same time, raise your right arm and straighten it in front of you. Do not lift your leg higher than your hip. Do not lift your arm higher  than your shoulder. Keep your abdominal and back muscles tight. Keep your hips facing the ground. Do not arch your back. Keep your balance carefully, and do not hold your breath. Hold this position for __________ seconds. Slowly return to the starting position. Repeat with your right leg and your left arm. Repeat __________ times. Complete this exercise __________ times a day. Abdominal set with straight leg raise  Lie on your back on a firm bed or the floor. Bend one of your knees and keep your other leg straight. Tense your abdominal muscles and lift your straight leg up, 4-6 inches (10-15 cm) off the ground. Keep your abdominal muscles tight and hold this position for __________ seconds. Do not hold your breath. Do not arch your back. Keep it flat against the ground. Keep your abdominal muscles tense as you slowly lower your leg back to the starting position. Repeat with your other leg. Repeat __________ times. Complete this exercise __________ times a day. Single leg lower with bent knees Lie on your back on a firm bed or the floor. Tense your abdominal muscles and lift your feet off the floor, one foot at a time, so your knees and hips are bent in 90-degree angles (right angles). Your knees should be over your hips and your lower legs should be parallel to the floor. Keeping your abdominal muscles tense and your knee bent, slowly lower one of your legs so your toe touches the ground. Lift your leg back up to return to the starting position. Do not hold your breath. Do not let your back arch. Keep your back flat against the ground. Repeat with your other leg. Repeat __________ times. Complete this exercise __________ times a day. Posture and body mechanics Good posture and healthy body mechanics can help to relieve stress in your body's tissues and joints. Body mechanics refers to the movements and positions of your body while you do your daily activities. Posture is part of body  mechanics. Good posture means: Your spine is in its natural S-curve position (neutral). Your shoulders are pulled back slightly. Your head is not tipped forward (neutral). Follow these guidelines to improve your posture and body mechanics in your everyday activities. Standing  When standing, keep your spine neutral and your feet about hip-width apart. Keep a slight bend in your knees. Your ears, shoulders, and hips should line up. When you do a task in which you stand in one place for a long time, place one foot up on a stable object that is 2-4 inches (5-10 cm) high, such as a footstool. This helps keep your spine neutral. Sitting  When sitting, keep your spine neutral and keep your feet flat on the floor. Use a footrest, if necessary, and keep your thighs parallel to the floor. Avoid rounding your shoulders, and avoid tilting your head forward. When working at a desk or a computer, keep your desk at a height where your hands are slightly lower than your elbows. Slide your   chair under your desk so you are close enough to maintain good posture. When working at a computer, place your monitor at a height where you are looking straight ahead and you do not have to tilt your head forward or downward to look at the screen. Resting When lying down and resting, avoid positions that are most painful for you. If you have pain with activities such as sitting, bending, stooping, or squatting, lie in a position in which your body does not bend very much. For example, avoid curling up on your side with your arms and knees near your chest (fetal position). If you have pain with activities such as standing for a long time or reaching with your arms, lie with your spine in a neutral position and bend your knees slightly. Try the following positions: Lying on your side with a pillow between your knees. Lying on your back with a pillow under your knees. Lifting  When lifting objects, keep your feet at least  shoulder-width apart and tighten your abdominal muscles. Bend your knees and hips and keep your spine neutral. It is important to lift using the strength of your legs, not your back. Do not lock your knees straight out. Always ask for help to lift heavy or awkward objects. This information is not intended to replace advice given to you by your health care provider. Make sure you discuss any questions you have with your health care provider. Document Revised: 05/15/2020 Document Reviewed: 05/15/2020 Elsevier Patient Education  [place].

## 2021-09-19 NOTE — Progress Notes (Signed)
Established Patient Office Visit  Subjective   Patient ID: Olivia Miller, female    DOB: 11/17/1960  Age: 61 y.o. MRN: [id number]  Chief Complaint  Patient presents with   Back Pain    HPI Pt is an obese 61 yo female with spinal stenosis and chronic left sided low back pain who had an acute flare at the end of June and here to follow up on it. She bent down to tie her grandkids shoe and when she stood up she felt the left sided back and radiation into left leg. She called in and asked for prednisone because of her history. She took some OTC medications and never had to take prednisone. It is much better today. She denies any radiation of pain.   .. Active Ambulatory Problems    Diagnosis Date Noted   Dyslipidemia 03/03/2015   Tachycardia 03/03/2015   Left cervical radiculopathy 03/03/2015   OAB (overactive bladder) 03/03/2015   DDD (degenerative disc disease), cervical 03/03/2015   Calcification of right carotid artery 06/07/2015   Snoring 04/02/2016   Apneic episode 04/02/2016   Non-restorative sleep 04/02/2016   Clostridium difficile colitis 04/18/2016   Breast mass, right 04/23/2016   Cervical polyp 08/06/2016   Tinea pedis of left foot 08/06/2016   Fibromyalgia 03/10/2017   Nasal congestion 03/10/2017   Palpitations 03/10/2017   Esophageal dysphagia 03/10/2017   Class 1 obesity due to excess calories with serious comorbidity and body mass index (BMI) of 34.0 to 34.9 in adult 03/12/2017   Family history of bone cancer 03/12/2017   Chronic bilateral thoracic back pain 03/12/2017   Chronic bilateral low back pain with left-sided sciatica 03/12/2017   Female cystocele 02/04/2018   Rectocele 02/04/2018   Chronic pain of both knees 02/04/2018   Primary osteoarthritis of knee 02/04/2018   Plantar fasciitis of right foot 02/16/2018   Irritant contact dermatitis due to detergent 07/15/2018   Onychomycosis 07/15/2018   Vitamin D insufficiency 07/15/2018   Cough 03/25/2019    Gastroesophageal reflux disease 03/25/2019   Wheezing 03/25/2019   Chronic rhinitis 03/25/2019   Neck pain 04/05/2019   Elevated blood pressure reading 04/05/2019   Pre-diabetes 04/05/2019   Atypical chest pain 04/07/2019   Hiatal hernia 04/12/2011   Asthmatic bronchitis 03/21/2020   COVID-19 virus infection 03/21/2020   DDD (degenerative disc disease), lumbar 11/14/2020   Multiple skin nodules 11/15/2020   Statin intolerance 08/13/2021   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   Mixed hyperlipidemia 08/13/2021   Resolved Ambulatory Problems    Diagnosis Date Noted   No Resolved Ambulatory Problems   Past Medical History:  Diagnosis Date   GERD (gastroesophageal reflux disease)    Hyperlipidemia       ROS See HPI.    Objective:     BP 121/79   Pulse 80   Ht '5\' 3"'$  (1.6 m)   Wt 191 lb (86.6 kg)   LMP 05/21/2015 (Within Days)   SpO2 98%   BMI 33.83 kg/m  BP Readings from Last 3 Encounters:  09/19/21 121/79  08/10/21 (!) 131/92  11/10/20 (!) 141/84      Physical Exam Constitutional:      Appearance: Normal appearance. She is obese.  Cardiovascular:     Rate and Rhythm: Normal rate.  Pulmonary:     Effort: Pulmonary effort is normal.  Musculoskeletal:     Right lower leg: No edema.     Left lower leg: No edema.  Comments: NROM at waist.  Neurological:     General: No focal deficit present.     Mental Status: She is alert and oriented to person, place, and time.         Assessment & Plan:  Marland KitchenMarland KitchenNiketa was seen today for back pain.  Diagnoses and all orders for this visit:  Spinal stenosis of lumbar region with neurogenic claudication -     cyclobenzaprine (FLEXERIL) 5 MG tablet; Take 1 tablet (5 mg total) by mouth 3 (three) times daily as needed for muscle spasms. -     meloxicam (MOBIC) 15 MG tablet; Take 1 tablet (15 mg total) by mouth daily. For low back pain flares.  Chronic bilateral low back pain  without sciatica -     cyclobenzaprine (FLEXERIL) 5 MG tablet; Take 1 tablet (5 mg total) by mouth 3 (three) times daily as needed for muscle spasms. -     meloxicam (MOBIC) 15 MG tablet; Take 1 tablet (15 mg total) by mouth daily. For low back pain flares.  Discussed low back pain and management Start yoga/piliates/stretches/core work outs Encouraged good lifting techniques Ok to start mobic as needed or daily Flexeril as needed for flares Encouraged tens unit and all conservative measures   Return if symptoms worsen or fail to improve.    Iran Planas, PA-C

## 2021-10-19 DIAGNOSIS — K6289 Other specified diseases of anus and rectum: Secondary | ICD-10-CM | POA: Diagnosis not present

## 2021-10-19 DIAGNOSIS — N816 Rectocele: Secondary | ICD-10-CM | POA: Diagnosis not present

## 2021-11-26 NOTE — Progress Notes (Unsigned)
   ANNUAL EXAM Patient name: Olivia Miller MRN [id number]  Date of birth: 1960-03-19 Chief Complaint:   No chief complaint on file.  History of Present Illness:   Olivia Miller is a 61 y.o. [phone] female being seen today for a routine annual exam.   Current complaints: Has prolapse but seeing urogyn [place] [facility]) and has surgery scheduled for 10/16 through [facility].   Patient's last menstrual period was 05/21/2015 (within days).  Last pap: 11/2019. Results were: NILM w/ HRHPV negative. H/O abnormal pap: no Last MXR: 11/2019  Health Maintenance Due  Topic Date Due   COVID-19 Vaccine (1) Never done    Review of Systems:   Pertinent items are noted in HPI Denies any headaches, blurred vision, fatigue, shortness of breath, chest pain, abdominal pain, abnormal vaginal discharge/itching/odor/irritation, problems with periods, bowel movements, urination, or intercourse unless otherwise stated above. *** Pertinent History Reviewed:  Reviewed past medical,surgical, social and family history.  Reviewed problem list, medications and allergies. Physical Assessment:  There were no vitals filed for this visit.There is no height or weight on file to calculate BMI.   Physical Examination:  General appearance - well appearing, and in no distress Mental status - alert, oriented to person, place, and time Psych:  She has a normal mood and affect Skin - warm and dry, normal color, no suspicious lesions noted Chest - effort normal Heart - normal rate  Breasts - breasts appear normal, no suspicious masses, no skin or nipple changes or axillary nodes Abdomen - soft, nontender, nondistended, no masses or organomegaly Pelvic -  VULVA: normal appearing vulva with no masses, tenderness or lesions  VAGINA: normal appearing vagina with normal color and discharge, no lesions  CERVIX: normal appearing cervix without discharge or lesions, no CMT UTERUS: uterus is felt to be normal size, shape,  consistency and nontender  ADNEXA: No adnexal masses or tenderness noted. Extremities:  No swelling or varicosities noted  Chaperone present for exam  No results found for this or any previous visit (from the past 24 hour(s)).  Assessment & Plan:  Diagnoses and all orders for this visit:  Encounter for annual routine gynecological examination  - Cervical cancer screening: Discussed guidelines. Pap with HPV wnl 05/2019 - Breast Health: Encouraged self breast awareness/SBE. Discussed limits of clinical breast exam for detecting breast cancer. Discussed importance of annual MXR. Rx given for MXR - Climacteric/Sexual health: Reviewed typical and atypical symptoms of menopause/peri-menopause. Discussed PMB and to call if any amount of spotting.  - Colonoscopy:  2021 - F/U 12 months and prn     No orders of the defined types were placed in this encounter.   Meds: No orders of the defined types were placed in this encounter.   Follow-up: No follow-ups on file.  Radene Gunning, MD 11/26/2021 9:44 AM

## 2021-11-29 ENCOUNTER — Encounter: Payer: Self-pay | Admitting: Obstetrics and Gynecology

## 2021-11-29 ENCOUNTER — Other Ambulatory Visit (HOSPITAL_COMMUNITY)
Admission: RE | Admit: 2021-11-29 | Discharge: 2021-11-29 | Disposition: A | Discharge status: Home / Self Care | Payer: 59 | Source: Ambulatory Visit | Attending: Obstetrics and Gynecology | Admitting: Obstetrics and Gynecology

## 2021-11-29 ENCOUNTER — Ambulatory Visit (INDEPENDENT_AMBULATORY_CARE_PROVIDER_SITE_OTHER): Payer: 59 | Admitting: Obstetrics and Gynecology

## 2021-11-29 VITALS — BP 102/57 | HR 96 | Ht 62.0 in | Wt 197.0 lb

## 2021-11-29 DIAGNOSIS — Z113 Encounter for screening for infections with a predominantly sexual mode of transmission: Secondary | ICD-10-CM

## 2021-11-29 DIAGNOSIS — Z01419 Encounter for gynecological examination (general) (routine) without abnormal findings: Secondary | ICD-10-CM | POA: Diagnosis not present

## 2021-11-29 DIAGNOSIS — R69 Illness, unspecified: Secondary | ICD-10-CM | POA: Diagnosis not present

## 2021-11-29 DIAGNOSIS — R399 Unspecified symptoms and signs involving the genitourinary system: Secondary | ICD-10-CM | POA: Diagnosis not present

## 2021-11-30 LAB — HIV ANTIBODY (ROUTINE TESTING W REFLEX): HIV 1&2 Ab, 4th Generation: NONREACTIVE

## 2021-11-30 LAB — RPR: RPR Ser Ql: NONREACTIVE

## 2021-11-30 LAB — HEPATITIS C ANTIBODY: Hepatitis C Ab: NONREACTIVE

## 2021-12-01 LAB — URINE CULTURE
MICRO NUMBER:: 13954344
Result:: NO GROWTH
SPECIMEN QUALITY:: ADEQUATE

## 2021-12-01 LAB — GC/CHLAMYDIA PROBE AMP (~~LOC~~) NOT AT ARMC
Chlamydia: NEGATIVE
Comment: NEGATIVE
Comment: NORMAL
Neisseria Gonorrhea: NEGATIVE

## 2021-12-24 DIAGNOSIS — N816 Rectocele: Secondary | ICD-10-CM | POA: Diagnosis not present

## 2021-12-24 DIAGNOSIS — N815 Vaginal enterocele: Secondary | ICD-10-CM | POA: Diagnosis not present

## 2022-01-01 ENCOUNTER — Ambulatory Visit: Payer: 59 | Admitting: Physician Assistant

## 2022-01-01 VITALS — BP 123/80 | HR 90 | Ht 62.0 in | Wt 193.0 lb

## 2022-01-01 DIAGNOSIS — L989 Disorder of the skin and subcutaneous tissue, unspecified: Secondary | ICD-10-CM

## 2022-01-01 NOTE — Progress Notes (Signed)
Established Patient Office Visit  Subjective   Patient ID: Olivia Miller, female    DOB: 1960-11-07  Age: 61 y.o. MRN: [id number]  Chief Complaint  Patient presents with   Follow-up    HPI Pt is a 61 yo female who presents to the clinic to discuss skin ulcer on left buttocks. She has had on and off for years but never knew what it was. She read that it could be herpes and wants it checked out. She did have anal sphinter pelvic prolapse repair on 10/16. It is very tender and over the yeas has had intermittent flu like symptoms.   .. Active Ambulatory Problems    Diagnosis Date Noted   Dyslipidemia 03/03/2015   Tachycardia 03/03/2015   Left cervical radiculopathy 03/03/2015   OAB (overactive bladder) 03/03/2015   DDD (degenerative disc disease), cervical 03/03/2015   Calcification of right carotid artery 06/07/2015   Snoring 04/02/2016   Apneic episode 04/02/2016   Non-restorative sleep 04/02/2016   Clostridium difficile colitis 04/18/2016   Breast mass, right 04/23/2016   Tinea pedis of left foot 08/06/2016   Fibromyalgia 03/10/2017   Nasal congestion 03/10/2017   Palpitations 03/10/2017   Esophageal dysphagia 03/10/2017   Class 1 obesity due to excess calories with serious comorbidity and body mass index (BMI) of 34.0 to 34.9 in adult 03/12/2017   Family history of bone cancer 03/12/2017   Chronic bilateral thoracic back pain 03/12/2017   Chronic bilateral low back pain with left-sided sciatica 03/12/2017   Female cystocele 02/04/2018   Rectocele 02/04/2018   Chronic pain of both knees 02/04/2018   Primary osteoarthritis of knee 02/04/2018   Plantar fasciitis of right foot 02/16/2018   Irritant contact dermatitis due to detergent 07/15/2018   Onychomycosis 07/15/2018   Vitamin D insufficiency 07/15/2018   Cough 03/25/2019   Gastroesophageal reflux disease 03/25/2019   Wheezing 03/25/2019   Chronic rhinitis 03/25/2019   Neck pain 04/05/2019   Elevated blood  pressure reading 04/05/2019   Pre-diabetes 04/05/2019   Atypical chest pain 04/07/2019   Hiatal hernia 04/12/2011   Asthmatic bronchitis 03/21/2020   COVID-19 virus infection 03/21/2020   DDD (degenerative disc disease), lumbar 11/14/2020   Multiple skin nodules 11/15/2020   Statin intolerance 08/13/2021   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   Mixed hyperlipidemia 08/13/2021   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    Objective:     BP 123/80   Pulse 90   Ht 5\' 2"  (1.575 m)   Wt 193 lb (87.5 kg)   LMP 05/21/2015 (Within Days)   SpO2 100%   BMI 35.30 kg/m  BP Readings from Last 3 Encounters:  01/01/22 123/80  11/29/21 (!) 102/57  09/19/21 121/79   Wt Readings from Last 3 Encounters:  01/01/22 193 lb (87.5 kg)  11/29/21 197 lb (89.4 kg)  09/19/21 191 lb (86.6 kg)      Physical Exam  2cm by 5mm erythematous linear ulceration left butt cheek in the gluteal crease out from 9oclock from anus    Assessment & Plan:  Marland KitchenMarland KitchenElizza was seen today for follow-up.  Diagnoses and all orders for this visit:  Skin lesion -     Herpes simplex virus culture   Will culture but  appears more like irritation and thin skin. Suggested zinc oxide as needed.    Tandy Gaw, PA-C

## 2022-01-01 NOTE — Patient Instructions (Signed)
Use zinc oxide on area twice a day for 2 weeks.

## 2022-01-02 ENCOUNTER — Encounter: Payer: Self-pay | Admitting: Physician Assistant

## 2022-01-03 LAB — HERPES SIMPLEX VIRUS CULTURE
MICRO NUMBER:: 14093408
SPECIMEN QUALITY:: ADEQUATE

## 2022-01-04 NOTE — Progress Notes (Signed)
No herpes virus detected.

## 2022-02-15 DIAGNOSIS — Z9889 Other specified postprocedural states: Secondary | ICD-10-CM | POA: Diagnosis not present

## 2022-02-15 DIAGNOSIS — N816 Rectocele: Secondary | ICD-10-CM | POA: Diagnosis not present

## 2022-02-15 DIAGNOSIS — K6289 Other specified diseases of anus and rectum: Secondary | ICD-10-CM | POA: Diagnosis not present

## 2022-02-15 DIAGNOSIS — R32 Unspecified urinary incontinence: Secondary | ICD-10-CM | POA: Diagnosis not present

## 2022-04-23 ENCOUNTER — Inpatient Hospital Stay: Payer: Self-pay | Admitting: Physician Assistant

## 2022-05-21 ENCOUNTER — Ambulatory Visit: Payer: BLUE CROSS/BLUE SHIELD | Admitting: Physician Assistant

## 2022-05-21 ENCOUNTER — Encounter: Payer: Self-pay | Admitting: Physician Assistant

## 2022-05-21 VITALS — BP 146/77 | HR 83 | Ht 62.0 in | Wt 200.0 lb

## 2022-05-21 DIAGNOSIS — I10 Essential (primary) hypertension: Secondary | ICD-10-CM | POA: Insufficient documentation

## 2022-05-21 DIAGNOSIS — E785 Hyperlipidemia, unspecified: Secondary | ICD-10-CM | POA: Diagnosis not present

## 2022-05-21 DIAGNOSIS — R7303 Prediabetes: Secondary | ICD-10-CM | POA: Diagnosis not present

## 2022-05-21 DIAGNOSIS — R1084 Generalized abdominal pain: Secondary | ICD-10-CM | POA: Insufficient documentation

## 2022-05-21 DIAGNOSIS — Z09 Encounter for follow-up examination after completed treatment for conditions other than malignant neoplasm: Secondary | ICD-10-CM

## 2022-05-21 DIAGNOSIS — L299 Pruritus, unspecified: Secondary | ICD-10-CM | POA: Insufficient documentation

## 2022-05-21 DIAGNOSIS — R140 Abdominal distension (gaseous): Secondary | ICD-10-CM

## 2022-05-21 DIAGNOSIS — T887XXA Unspecified adverse effect of drug or medicament, initial encounter: Secondary | ICD-10-CM | POA: Insufficient documentation

## 2022-05-21 DIAGNOSIS — I214 Non-ST elevation (NSTEMI) myocardial infarction: Secondary | ICD-10-CM | POA: Diagnosis not present

## 2022-05-21 DIAGNOSIS — Z955 Presence of coronary angioplasty implant and graft: Secondary | ICD-10-CM | POA: Insufficient documentation

## 2022-05-21 MED ORDER — ROSUVASTATIN CALCIUM 40 MG PO TABS: 40.0000 mg | ORAL_TABLET | Freq: Every day | ORAL | 0 refills | Status: DC

## 2022-05-21 NOTE — Progress Notes (Signed)
Established Patient Office Visit  Subjective   Patient ID: Olivia Miller, female    DOB: 04/15/1960  Age: 63 y.o. MRN: [id number]  Chief Complaint  Patient presents with   Hospitalization Follow-up    HPI Pt is a 62 yo obese female who presents to the clinic after presenting to ED with chest pain and found to have NSTEMI and needed stent placement.   See ED/Admission summary and discharge note:   Patient initially presented to the ED with concerns for acute, typical substernal chest pain. On evaluation, noted to be hemodynamically stable, with elevated troponins that continued to rise up to 1500, BNP<50, EKG without ischemic changes. CTA chest without evidence of PE. Her presentation was concerning for ACS and she was loaded with aspirin and started on heparin gtt. Risk stratification labs revealed normal TSH, A1c of 6.2, LDL of 205. She underwent LHC which showed 99% stenosis of in mid-LCx, s/p DES x 2. TTE showed preserved EF 60-65%, no significant valvular abnormality. She was continued on DAPT therapy with aspirin and plavix to be continued for at least a year and otherwise deemed stable for discharge home with outpatient follow-up. Patient was transitioned from crestor to atorvastatin, as she revealed she had not taken her previous home crestor for >1 year due to myalgias. She was started on GDMT with metoprolol and losartan with plan for further uptitration of these medications as an outpatient.   On day of discharge, patient is clinically stable with no new examination findings or acute symptoms compared to prior. The patient was seen by the attending physician on the date of discharge and deemed stable and acceptable for discharge. The patient's chronic medical conditions were treated accordingly per the patient's home medication regimen. The patient's medication reconciliation, follow-up appointments, discharge orders, instructions and significant lab and diagnostic studies are as  noted.     SUMMARY The left ventricular size is normal. Left ventricular systolic function is normal. LV ejection fraction = 60-65%. The right ventricle is normal in size and function. There was insufficient TR detected to calculate RV systolic pressure. IVC size was normal. There is no significant valvular stenosis or regurgitation. There is no pericardial effusion.   1. No acute cardiopulmonary abnormalities.  2. Findings compatible with large hiatal hernia described above when compared to contemporaneous CT chest.   Pt did follow up with cardiology on 04/23/2022.  She was referred to nutrition Ordered sleep study And discussed GLP-1 for weight loss last A1c was 6.2.  She was left on metoprolol, losartan, lipitor, plavix, ASA.   She has not been able to tolerate statin due to headaches and had to cut in half. She has also had itching since starting all these medications. No rash. No SOB.   She does endorse GI symptoms for months even before stent with abdominal fullness and pain and bloating. Last colonoscopy 7/21. She denies any melena, hematochezia, diarrhea, or constipation. She does not notice food correlation.     .. Active Ambulatory Problems    Diagnosis Date Noted   Dyslipidemia 03/03/2015   Tachycardia 03/03/2015   Left cervical radiculopathy 03/03/2015   OAB (overactive bladder) 03/03/2015   DDD (degenerative disc disease), cervical 03/03/2015   Calcification of right carotid artery 06/07/2015   Snoring 04/02/2016   Apneic episode 04/02/2016   Non-restorative sleep 04/02/2016   Clostridium difficile colitis 04/18/2016   Breast mass, right 04/23/2016   Tinea pedis of left foot 08/06/2016   Fibromyalgia 03/10/2017   Nasal congestion 03/10/2017  Palpitations 03/10/2017   Esophageal dysphagia 03/10/2017   Class 1 obesity due to excess calories with serious comorbidity and body mass index (BMI) of 34.0 to 34.9 in adult 03/12/2017   Family history of bone cancer  03/12/2017   Chronic bilateral thoracic back pain 03/12/2017   Chronic bilateral low back pain with left-sided sciatica 03/12/2017   Female cystocele 02/04/2018   Rectocele 02/04/2018   Chronic pain of both knees 02/04/2018   Primary osteoarthritis of knee 02/04/2018   Plantar fasciitis of right foot 02/16/2018   Irritant contact dermatitis due to detergent 07/15/2018   Onychomycosis 07/15/2018   Vitamin D insufficiency 07/15/2018   Cough 03/25/2019   Gastroesophageal reflux disease 03/25/2019   Wheezing 03/25/2019   Chronic rhinitis 03/25/2019   Neck pain 04/05/2019   Elevated blood pressure reading 04/05/2019   Pre-diabetes 04/05/2019   Atypical chest pain 04/07/2019   Hiatal hernia 04/12/2011   Asthmatic bronchitis 03/21/2020   COVID-19 virus infection 03/21/2020   DDD (degenerative disc disease), lumbar 11/14/2020   Multiple skin nodules 11/15/2020   Statin intolerance 08/13/2021   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   Mixed hyperlipidemia 08/13/2021   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   Class 2 severe obesity due to excess calories with serious comorbidity and body mass index (BMI) of 36.0 to 36.9 in adult [facility]) 05/21/2022   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/21/2022   NSTEMI (non-ST elevated myocardial infarction) ([place]) 05/21/2022   Primary hypertension 05/21/2022   Itching 05/21/2022   Medication side effects 05/21/2022   Generalized abdominal pain 05/21/2022   Resolved Ambulatory Problems    Diagnosis Date Noted   Cervical polyp 08/06/2016   Past Medical History:  Diagnosis Date   GERD (gastroesophageal reflux disease)    Hyperlipidemia      ROS See HPI>    Objective:     BP (!) 146/77   Pulse 83   Ht '5\' 2"'$  (1.575 m)   Wt 200 lb (90.7 kg)   LMP 05/21/2015 (Within Days)   SpO2 100%   BMI 36.58 kg/m  BP Readings from Last 3 Encounters:  05/21/22 (!) 146/77  01/01/22 123/80  11/29/21 (!) 102/57   Wt Readings from  Last 3 Encounters:  05/21/22 200 lb (90.7 kg)  01/01/22 193 lb (87.5 kg)  11/29/21 197 lb (89.4 kg)      Physical Exam Constitutional:      Appearance: Normal appearance. She is obese.  HENT:     Head: Normocephalic.  Neck:     Vascular: No carotid bruit.  Cardiovascular:     Rate and Rhythm: Normal rate and regular rhythm.     Pulses: Normal pulses.     Heart sounds: Normal heart sounds.  Pulmonary:     Effort: Pulmonary effort is normal.     Breath sounds: Normal breath sounds.  Abdominal:     General: There is no distension.     Palpations: Abdomen is soft. There is no mass.     Tenderness: There is no abdominal tenderness. There is no right CVA tenderness, left CVA tenderness, guarding or rebound.     Hernia: No hernia is present.  Musculoskeletal:     Cervical back: Normal range of motion and neck supple. No rigidity or tenderness.     Right lower leg: No edema.     Left lower leg: No edema.  Lymphadenopathy:     Cervical: No cervical adenopathy.  Neurological:     General: No focal deficit present.  Mental Status: She is alert and oriented to person, place, and time.  Psychiatric:        Mood and Affect: Mood normal.         Assessment & Plan:  ,..Viktorya was seen today for hospitalization follow-up.  Diagnoses and all orders for this visit:  Hospital discharge follow-up  Pre-diabetes  NSTEMI (non-ST elevated myocardial infarction) ([place])  Stented coronary artery  Class 2 severe obesity due to excess calories with serious comorbidity and body mass index (BMI) of 36.0 to 36.9 in adult [facility])  Dyslipidemia -     rosuvastatin (CRESTOR) 40 MG tablet; Take 1 tablet (40 mg total) by mouth daily.  Primary hypertension  Itching  Medication side effects  Generalized abdominal pain -     Ambulatory referral to Gastroenterology  Abdominal bloating -     Ambulatory referral to Gastroenterology   Doing well overall.  Concern for medication side  effects with Lipitor headache and possible itching. Stop lipitor and start crestor at highest dose '40mg'$  daily.  BP not to goal, keep checking at home with goal of 130/80. If not at goal increase losartan to '50mg'$  daily.  Follow up in 2 month.  Referral to GI for pain and bloating, suggested to start probiotic.  Discussed weight loss HO for ozempic given  Concerned about cost and starting if already having GI issues Will get GI consult first Work on 150 minutes of exercise a week and heart healthy diet  Spent 50 minutes reviewing chart, imaging, labs and discussing medications and plan.      Olivia Planas, PA-C

## 2022-05-21 NOTE — Patient Instructions (Addendum)
Will make referral to GI Start probiotic Stop lipitor Start crestor Consider GLP-1  Semaglutide Injection What is this medication? SEMAGLUTIDE (SEM a GLOO tide) treats type 2 diabetes. It works by increasing insulin levels in your body, which decreases your blood sugar (glucose). It also reduces the amount of sugar released into the blood and slows down your digestion. It can also be used to lower the risk of heart attack and stroke in people with type 2 diabetes. Changes to diet and exercise are often combined with this medication. This medicine may be used for other purposes; ask your health care provider or pharmacist if you have questions. COMMON BRAND NAME(S): OZEMPIC What should I tell my care team before I take this medication? They need to know if you have any of these conditions: Endocrine tumors (MEN 2) or if someone in your family had these tumors Eye disease, vision problems History of pancreatitis Kidney disease Stomach problems Thyroid cancer or if someone in your family had thyroid cancer An unusual or allergic reaction to semaglutide, other medications, foods, dyes, or preservatives Pregnant or trying to get pregnant Breast-feeding How should I use this medication? This medication is for injection under the skin of your upper leg (thigh), stomach area, or upper arm. It is given once every week (every 7 days). You will be taught how to prepare and give this medication. Use exactly as directed. Take your medication at regular intervals. Do not take it more often than directed. If you use this medication with insulin, you should inject this medication and the insulin separately. Do not mix them together. Do not give the injections right next to each other. Change (rotate) injection sites with each injection. It is important that you put your used needles and syringes in a special sharps container. Do not put them in a trash can. If you do not have a sharps container, call your  pharmacist or care team to get one. A special MedGuide will be given to you by the pharmacist with each prescription and refill. Be sure to read this information carefully each time. This medication comes with INSTRUCTIONS FOR USE. Ask your pharmacist for directions on how to use this medication. Read the information carefully. Talk to your pharmacist or care team if you have questions. Talk to your care team about the use of this medication in children. Special care may be needed. Overdosage: If you think you have taken too much of this medicine contact a poison control center or emergency room at once. NOTE: This medicine is only for you. Do not share this medicine with others. What if I miss a dose? If you miss a dose, take it as soon as you can within 5 days after the missed dose. Then take your next dose at your regular weekly time. If it has been longer than 5 days after the missed dose, do not take the missed dose. Take the next dose at your regular time. Do not take double or extra doses. If you have questions about a missed dose, contact your care team for advice. What may interact with this medication? Other medications for diabetes Many medications may cause changes in blood sugar, these include: Alcohol containing beverages Antiviral medications for HIV or AIDS Aspirin and aspirin-like medications Certain medications for blood pressure, heart disease, irregular heart beat Chromium Diuretics Female hormones, such as estrogens or progestins, birth control pills Fenofibrate Gemfibrozil Isoniazid Lanreotide Female hormones or anabolic steroids MAOIs like Carbex, Eldepryl, Marplan, Nardil, and Parnate  Medications for weight loss Medications for allergies, asthma, cold, or cough Medications for depression, anxiety, or psychotic disturbances Niacin Nicotine NSAIDs, medications for pain and inflammation, like ibuprofen or  naproxen Octreotide Pasireotide Pentamidine Phenytoin Probenecid Quinolone antibiotics such as ciprofloxacin, levofloxacin, ofloxacin Some herbal dietary supplements Steroid medications such as prednisone or cortisone Sulfamethoxazole; trimethoprim Thyroid hormones Some medications can hide the warning symptoms of low blood sugar (hypoglycemia). You may need to monitor your blood sugar more closely if you are taking one of these medications. These include: Beta-blockers, often used for high blood pressure or heart problems (examples include atenolol, metoprolol, propranolol) Clonidine Guanethidine Reserpine This list may not describe all possible interactions. Give your health care provider a list of all the medicines, herbs, non-prescription drugs, or dietary supplements you use. Also tell them if you smoke, drink alcohol, or use illegal drugs. Some items may interact with your medicine. What should I watch for while using this medication? Visit your care team for regular checks on your progress. Drink plenty of fluids while taking this medication. Check with your care team if you get an attack of severe diarrhea, nausea, and vomiting. The loss of too much body fluid can make it dangerous for you to take this medication. A test called the HbA1C (A1C) will be monitored. This is a simple blood test. It measures your blood sugar control over the last 2 to 3 months. You will receive this test every 3 to 6 months. Learn how to check your blood sugar. Learn the symptoms of low and high blood sugar and how to manage them. Always carry a quick-source of sugar with you in case you have symptoms of low blood sugar. Examples include hard sugar candy or glucose tablets. Make sure others know that you can choke if you eat or drink when you develop serious symptoms of low blood sugar, such as seizures or unconsciousness. They must get medical help at once. Tell your care team if you have high blood sugar.  You might need to change the dose of your medication. If you are sick or exercising more than usual, you might need to change the dose of your medication. Do not skip meals. Ask your care team if you should avoid alcohol. Many nonprescription cough and cold products contain sugar or alcohol. These can affect blood sugar. Pens should never be shared. Even if the needle is changed, sharing may result in passing of viruses like hepatitis or HIV. Wear a medical ID bracelet or chain, and carry a card that describes your disease and details of your medication and dosage times. Do not become pregnant while taking this medication. Women should inform their care team if they wish to become pregnant or think they might be pregnant. There is a potential for serious side effects to an unborn child. Talk to your care team for more information. What side effects may I notice from receiving this medication? Side effects that you should report to your care team as soon as possible: Allergic reactions--skin rash, itching, hives, swelling of the face, lips, tongue, or throat Change in vision Dehydration--increased thirst, dry mouth, feeling faint or lightheaded, headache, dark yellow or brown urine Gallbladder problems--severe stomach pain, nausea, vomiting, fever Heart palpitations--rapid, pounding, or irregular heartbeat Kidney injury--decrease in the amount of urine, swelling of the ankles, hands, or feet Pancreatitis--severe stomach pain that spreads to your back or gets worse after eating or when touched, fever, nausea, vomiting Thyroid cancer--new mass or lump in the neck, pain  or trouble swallowing, trouble breathing, hoarseness Side effects that usually do not require medical attention (report to your care team if they continue or are bothersome): Diarrhea Loss of appetite Nausea Stomach pain Vomiting This list may not describe all possible side effects. Call your doctor for medical advice about side  effects. You may report side effects to FDA at 1-800-FDA-1088. Where should I keep my medication? Keep out of the reach of children. Store unopened pens in a refrigerator between 2 and 8 degrees C (36 and 46 degrees F). Do not freeze. Protect from light and heat. After you first use the pen, it can be stored for 56 days at room temperature between 15 and 30 degrees C (59 and 86 degrees F) or in a refrigerator. Throw away your used pen after 56 days or after the expiration date, whichever comes first. Do not store your pen with the needle attached. If the needle is left on, medication may leak from the pen. NOTE: This sheet is a summary. It may not cover all possible information. If you have questions about this medicine, talk to your doctor, pharmacist, or health care provider.  2023 Elsevier/Gold Standard (2020-06-01 00:00:00)

## 2022-06-24 ENCOUNTER — Other Ambulatory Visit: Payer: Self-pay | Admitting: Physician Assistant

## 2022-06-24 DIAGNOSIS — E785 Hyperlipidemia, unspecified: Secondary | ICD-10-CM

## 2022-06-25 MED ORDER — ROSUVASTATIN CALCIUM 40 MG PO TABS: 40.0000 mg | ORAL_TABLET | Freq: Every day | ORAL | 2 refills | Status: DC

## 2022-06-28 ENCOUNTER — Ambulatory Visit (INDEPENDENT_AMBULATORY_CARE_PROVIDER_SITE_OTHER): Payer: BLUE CROSS/BLUE SHIELD | Admitting: Family Medicine

## 2022-06-28 ENCOUNTER — Encounter: Payer: Self-pay | Admitting: Family Medicine

## 2022-06-28 VITALS — BP 113/82 | HR 78 | Temp 97.9°F | Ht 62.0 in | Wt 194.0 lb

## 2022-06-28 DIAGNOSIS — J029 Acute pharyngitis, unspecified: Secondary | ICD-10-CM | POA: Diagnosis not present

## 2022-06-28 DIAGNOSIS — J40 Bronchitis, not specified as acute or chronic: Secondary | ICD-10-CM | POA: Diagnosis not present

## 2022-06-28 DIAGNOSIS — J329 Chronic sinusitis, unspecified: Secondary | ICD-10-CM

## 2022-06-28 LAB — POCT RAPID STREP A (OFFICE): Rapid Strep A Screen: NEGATIVE

## 2022-06-28 MED ORDER — HYDROCOD POLI-CHLORPHE POLI ER 10-8 MG/5ML PO SUER: 5.0000 mL | Freq: Two times a day (BID) | ORAL | 0 refills | Status: DC | PRN

## 2022-06-28 MED ORDER — METHYLPREDNISOLONE 4 MG PO TBPK: ORAL_TABLET | ORAL | 0 refills | Status: DC

## 2022-06-28 MED ORDER — DOXYCYCLINE HYCLATE 100 MG PO TABS: 100.0000 mg | ORAL_TABLET | Freq: Two times a day (BID) | ORAL | 0 refills | Status: AC

## 2022-06-28 NOTE — Assessment & Plan Note (Addendum)
Will go ahead and order strep test for sore throat. Poc strep: negative - recommend salt water gargles and warm tea with honey

## 2022-06-28 NOTE — Assessment & Plan Note (Addendum)
62 year old female presents with multiple weeks of cough and sputum production as well as congestion. -Breathing treatment given in clinic.  Wheezing has improved  after treatment - will go ahead and give medrol dose pack for inflammation - tussinex given: pmp reviewed and verified with no red flags - will give doxycycline due to wheezing and duration of symptoms

## 2022-06-28 NOTE — Progress Notes (Signed)
Acute Office Visit  Subjective:     Patient ID: Olivia Miller, female    DOB: 1960/03/30, 62 y.o.   MRN: [id number]  Chief Complaint  Patient presents with   Sore Throat    All symptoms started this week, Tues or wed she says.  She has taken Coricidin, but says has not helped.    Nasal Congestion    She states she has seen blood in mucus.    Cough    Sore Throat  Associated symptoms include coughing. Pertinent negatives include no headaches or shortness of breath.  Cough Associated symptoms include wheezing. Pertinent negatives include no chest pain, chills, fever, headaches or shortness of breath.   Patient is in today for cough and congestion.  She said cough has been present for multiple weeks now.  She has been using some over-the-counter medication with no relief.  She does admit to wheezing/rattling at night.  She does have an inhaler prescribed by PCP, but she is not using it.  He denies fever, body aches, and chills.  Review of Systems  Constitutional:  Negative for chills and fever.  Respiratory:  Positive for cough and wheezing. Negative for shortness of breath.   Cardiovascular:  Negative for chest pain.  Neurological:  Negative for headaches.      Objective:    BP 113/82   Pulse 78   Temp 97.9 F (36.6 C) (Oral)   Ht  (1.575 m)   Wt 194 lb (88 kg)   LMP 05/21/2015 (Within Days)   SpO2 99%   BMI 35.48 kg/m    Physical Exam Vitals and nursing note reviewed.  Constitutional:      General: She is not in acute distress.    Appearance: Normal appearance.  HENT:     Head: Normocephalic and atraumatic.     Right Ear: External ear normal.     Left Ear: External ear normal.     Nose: Nose normal.  Eyes:     Conjunctiva/sclera: Conjunctivae normal.  Cardiovascular:     Rate and Rhythm: Normal rate and regular rhythm.  Pulmonary:     Effort: Pulmonary effort is normal.     Breath sounds: Wheezing and rales present.  Neurological:     General: No  focal deficit present.     Mental Status: She is alert and oriented to person, place, and time.  Psychiatric:        Mood and Affect: Mood normal.        Behavior: Behavior normal.        Thought Content: Thought content normal.        Judgment: Judgment normal.    Results for orders placed or performed in visit on 06/28/22  POCT rapid strep A  Result Value Ref Range   Rapid Strep A Screen Negative Negative        Assessment & Plan:   Problem List Items Addressed This Visit       Respiratory   Sinobronchitis - Primary    62 year old female presents with multiple weeks of cough and sputum production as well as congestion. -Breathing treatment given in clinic.  Wheezing has improved  after treatment - will go ahead and give medrol dose pack for inflammation - tussinex given: pmp reviewed and verified with no red flags - will give doxycycline due to wheezing and duration of symptoms      Relevant Medications   Acetaminophen-DM (CORICIDIN HBP COLD/COUGH/FLU PO)   methylPREDNISolone (MEDROL DOSEPAK) 4 MG  TBPK tablet   chlorpheniramine-HYDROcodone (TUSSIONEX) 10-8 MG/5ML   doxycycline (VIBRA-TABS) 100 MG tablet     Other   Sore throat    Will go ahead and order strep test for sore throat. Poc strep: negative - recommend salt water gargles and warm tea with honey      Relevant Orders   POCT rapid strep A (Completed)    Meds ordered this encounter  Medications   methylPREDNISolone (MEDROL DOSEPAK) 4 MG TBPK tablet    Sig: Follow instructions on pill pack    Dispense:  21 tablet    Refill:  0   chlorpheniramine-HYDROcodone (TUSSIONEX) 10-8 MG/5ML    Sig: Take 5 mLs by mouth every 12 (twelve) hours as needed for cough (cough, will cause drowsiness.).    Dispense:  120 mL    Refill:  0   doxycycline (VIBRA-TABS) 100 MG tablet    Sig: Take 1 tablet (100 mg total) by mouth 2 (two) times daily for 5 days.    Dispense:  10 tablet    Refill:  0    Return if symptoms  worsen or fail to improve.  Charlton Amor, DO

## 2022-07-16 ENCOUNTER — Telehealth: Payer: Self-pay | Admitting: Physician Assistant

## 2022-07-16 DIAGNOSIS — I10 Essential (primary) hypertension: Secondary | ICD-10-CM

## 2022-07-16 DIAGNOSIS — E785 Hyperlipidemia, unspecified: Secondary | ICD-10-CM

## 2022-07-16 DIAGNOSIS — I214 Non-ST elevation (NSTEMI) myocardial infarction: Secondary | ICD-10-CM

## 2022-07-16 DIAGNOSIS — Z955 Presence of coronary angioplasty implant and graft: Secondary | ICD-10-CM

## 2022-07-16 MED ORDER — LOSARTAN POTASSIUM 25 MG PO TABS: 12.5000 mg | ORAL_TABLET | Freq: Every day | ORAL | 0 refills | Status: DC

## 2022-07-16 MED ORDER — CLOPIDOGREL BISULFATE 75 MG PO TABS: 75.0000 mg | ORAL_TABLET | Freq: Every day | ORAL | 0 refills | Status: DC

## 2022-07-16 MED ORDER — ROSUVASTATIN CALCIUM 40 MG PO TABS: 40.0000 mg | ORAL_TABLET | Freq: Every day | ORAL | 0 refills | Status: DC

## 2022-07-16 MED ORDER — ASPIRIN 81 MG PO CHEW: 81.0000 mg | CHEWABLE_TABLET | Freq: Every day | ORAL | 3 refills | Status: DC

## 2022-07-16 NOTE — Telephone Encounter (Signed)
Patient called to request refills Metotrolol Succinate 25mg , Losartan 25mg , clopidogrel 75mg  Aspirin 81 mg chewable please submit to [facility] [place] [facility]  [phone]

## 2022-10-07 ENCOUNTER — Encounter: Payer: Self-pay | Admitting: Physician Assistant

## 2022-10-07 ENCOUNTER — Telehealth (INDEPENDENT_AMBULATORY_CARE_PROVIDER_SITE_OTHER): Payer: BLUE CROSS/BLUE SHIELD | Admitting: Physician Assistant

## 2022-10-07 VITALS — Ht 63.0 in | Wt 205.0 lb

## 2022-10-07 DIAGNOSIS — Z6836 Body mass index (BMI) 36.0-36.9, adult: Secondary | ICD-10-CM

## 2022-10-07 DIAGNOSIS — E559 Vitamin D deficiency, unspecified: Secondary | ICD-10-CM

## 2022-10-07 DIAGNOSIS — I214 Non-ST elevation (NSTEMI) myocardial infarction: Secondary | ICD-10-CM

## 2022-10-07 DIAGNOSIS — E785 Hyperlipidemia, unspecified: Secondary | ICD-10-CM | POA: Diagnosis not present

## 2022-10-07 DIAGNOSIS — R7303 Prediabetes: Secondary | ICD-10-CM | POA: Diagnosis not present

## 2022-10-07 DIAGNOSIS — I10 Essential (primary) hypertension: Secondary | ICD-10-CM

## 2022-10-07 DIAGNOSIS — Z955 Presence of coronary angioplasty implant and graft: Secondary | ICD-10-CM

## 2022-10-07 DIAGNOSIS — E6609 Other obesity due to excess calories: Secondary | ICD-10-CM

## 2022-10-07 MED ORDER — OZEMPIC (0.25 OR 0.5 MG/DOSE) 2 MG/3ML ~~LOC~~ SOPN: 0.2500 mg | PEN_INJECTOR | SUBCUTANEOUS | 0 refills | Status: DC

## 2022-10-07 MED ORDER — ROSUVASTATIN CALCIUM 40 MG PO TABS: 40.0000 mg | ORAL_TABLET | Freq: Every day | ORAL | 0 refills | Status: DC

## 2022-10-07 MED ORDER — METOPROLOL SUCCINATE ER 25 MG PO TB24
25.0000 mg | ORAL_TABLET | Freq: Every day | ORAL | 1 refills | Status: AC
Start: 2022-10-07 — End: ?

## 2022-10-07 MED ORDER — LOSARTAN POTASSIUM 25 MG PO TABS: 12.5000 mg | ORAL_TABLET | Freq: Every day | ORAL | 1 refills | Status: DC

## 2022-10-07 MED ORDER — OZEMPIC (0.25 OR 0.5 MG/DOSE) 2 MG/3ML ~~LOC~~ SOPN: 0.5000 mg | PEN_INJECTOR | SUBCUTANEOUS | 1 refills | Status: DC

## 2022-10-07 MED ORDER — CLOPIDOGREL BISULFATE 75 MG PO TABS: 75.0000 mg | ORAL_TABLET | Freq: Every day | ORAL | 1 refills | Status: DC

## 2022-10-07 NOTE — Progress Notes (Signed)
..Virtual Visit via Video Note  I connected with Olivia Miller on 10/07/22 at 10:30 AM EDT by a video enabled telemedicine application and verified that I am speaking with the correct person using two identifiers.  Location: Patient: home Provider: clinic  .Marland KitchenParticipating in visit:  Patient: Olivia Miller Provider: Tandy Gaw PA-C   I discussed the limitations of evaluation and management by telemedicine and the availability of in person appointments. The patient expressed understanding and agreed to proceed.  History of Present Illness: Pt is a 62 yo female who calls into the clinic to discuss weight loss and follow up on medications.   Pt is doing ok. She has not been able to lose weight since last visit. She admits she is not eating and exercising like she should but finding it very hard to make these changes.  She denies any CP, palpitations, headaches or vision changes. She admits she has been out of lisinopril and metoprolol and needs refills. She wants to make sure she limits her risk of another MI. She does have a stent placement and on plavix. She has not seen cardiology since stent.   .. Active Ambulatory Problems    Diagnosis Date Noted   Dyslipidemia 03/03/2015   Tachycardia 03/03/2015   Left cervical radiculopathy 03/03/2015   OAB (overactive bladder) 03/03/2015   DDD (degenerative disc disease), cervical 03/03/2015   Calcification of right carotid artery 06/07/2015   Snoring 04/02/2016   Apneic episode 04/02/2016   Non-restorative sleep 04/02/2016   Clostridium difficile colitis 04/18/2016   Breast mass, right 04/23/2016   Tinea pedis of left foot 08/06/2016   Fibromyalgia 03/10/2017   Nasal congestion 03/10/2017   Palpitations 03/10/2017   Esophageal dysphagia 03/10/2017   Class 1 obesity due to excess calories with serious comorbidity and body mass index (BMI) of 34.0 to 34.9 in adult 03/12/2017   Family history of bone cancer 03/12/2017   Chronic bilateral  thoracic back pain 03/12/2017   Chronic bilateral low back pain with left-sided sciatica 03/12/2017   Female cystocele 02/04/2018   Rectocele 02/04/2018   Chronic pain of both knees 02/04/2018   Primary osteoarthritis of knee 02/04/2018   Plantar fasciitis of right foot 02/16/2018   Irritant contact dermatitis due to detergent 07/15/2018   Onychomycosis 07/15/2018   Vitamin D insufficiency 07/15/2018   Cough 03/25/2019   Gastroesophageal reflux disease 03/25/2019   Wheezing 03/25/2019   Chronic rhinitis 03/25/2019   Neck pain 04/05/2019   Elevated blood pressure reading 04/05/2019   Pre-diabetes 04/05/2019   Atypical chest pain 04/07/2019   Hiatal hernia 04/12/2011   Asthmatic bronchitis 03/21/2020   COVID-19 virus infection 03/21/2020   DDD (degenerative disc disease), lumbar 11/14/2020   Multiple skin nodules 11/15/2020   Statin intolerance 08/13/2021   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   Mixed hyperlipidemia 08/13/2021   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   Class 2 severe obesity due to excess calories with serious comorbidity and body mass index (BMI) of 36.0 to 36.9 in adult [facility]) 05/21/2022   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/21/2022   NSTEMI (non-ST elevated myocardial infarction) (HCC) 05/21/2022   Primary hypertension 05/21/2022   Itching 05/21/2022   Medication side effects 05/21/2022   Generalized abdominal pain 05/21/2022   Sinobronchitis 06/28/2022   Sore throat 06/28/2022   Class 2 obesity due to excess calories without serious comorbidity with body mass index (BMI) of 36.0 to 36.9 in adult 10/07/2022   Resolved Ambulatory Problems    Diagnosis Date Noted  Cervical polyp 08/06/2016   Past Medical History:  Diagnosis Date   GERD (gastroesophageal reflux disease)    Hyperlipidemia     Observations/Objective: No acute distress Normal breathing Normal mood  .[name] Today's Vitals   10/07/22 1027  Weight: 205 lb (93 kg)   Height: 5\' 3"  (1.6 m)   Body mass index is 36.31 kg/m.     Assessment and Plan: Marland KitchenMarland KitchenNairi was seen today for follow-up.  Diagnoses and all orders for this visit:  Class 2 obesity due to excess calories without serious comorbidity with body mass index (BMI) of 36.0 to 36.9 in adult -     Semaglutide,0.25 or 0.5MG /DOS, (OZEMPIC, 0.25 OR 0.5 MG/DOSE,) 2 MG/3ML SOPN; Inject 0.25 mg into the skin once a week. -     Semaglutide,0.25 or 0.5MG /DOS, (OZEMPIC, 0.25 OR 0.5 MG/DOSE,) 2 MG/3ML SOPN; Inject 0.5 mg into the skin once a week.  Stented coronary artery -     metoprolol succinate (TOPROL-XL) 25 MG 24 hr tablet; Take 1 tablet (25 mg total) by mouth daily. -     clopidogrel (PLAVIX) 75 MG tablet; Take 1 tablet (75 mg total) by mouth daily. -     Semaglutide,0.25 or 0.5MG /DOS, (OZEMPIC, 0.25 OR 0.5 MG/DOSE,) 2 MG/3ML SOPN; Inject 0.25 mg into the skin once a week. -     Semaglutide,0.25 or 0.5MG /DOS, (OZEMPIC, 0.25 OR 0.5 MG/DOSE,) 2 MG/3ML SOPN; Inject 0.5 mg into the skin once a week. -     Ambulatory referral to Cardiology  NSTEMI (non-ST elevated myocardial infarction) (HCC) -     Lipid panel -     TSH -     clopidogrel (PLAVIX) 75 MG tablet; Take 1 tablet (75 mg total) by mouth daily. -     Semaglutide,0.25 or 0.5MG /DOS, (OZEMPIC, 0.25 OR 0.5 MG/DOSE,) 2 MG/3ML SOPN; Inject 0.25 mg into the skin once a week. -     Semaglutide,0.25 or 0.5MG /DOS, (OZEMPIC, 0.25 OR 0.5 MG/DOSE,) 2 MG/3ML SOPN; Inject 0.5 mg into the skin once a week. -     Ambulatory referral to Cardiology  Primary hypertension -     TSH -     metoprolol succinate (TOPROL-XL) 25 MG 24 hr tablet; Take 1 tablet (25 mg total) by mouth daily. -     losartan (COZAAR) 25 MG tablet; Take 0.5 tablets (12.5 mg total) by mouth daily. -     Semaglutide,0.25 or 0.5MG /DOS, (OZEMPIC, 0.25 OR 0.5 MG/DOSE,) 2 MG/3ML SOPN; Inject 0.25 mg into the skin once a week. -     Semaglutide,0.25 or 0.5MG /DOS, (OZEMPIC, 0.25 OR 0.5  MG/DOSE,) 2 MG/3ML SOPN; Inject 0.5 mg into the skin once a week. -     Ambulatory referral to Cardiology  Dyslipidemia -     Lipid panel -     TSH -     metoprolol succinate (TOPROL-XL) 25 MG 24 hr tablet; Take 1 tablet (25 mg total) by mouth daily. -     losartan (COZAAR) 25 MG tablet; Take 0.5 tablets (12.5 mg total) by mouth daily. -     rosuvastatin (CRESTOR) 40 MG tablet; Take 1 tablet (40 mg total) by mouth daily. -     Semaglutide,0.25 or 0.5MG /DOS, (OZEMPIC, 0.25 OR 0.5 MG/DOSE,) 2 MG/3ML SOPN; Inject 0.25 mg into the skin once a week. -     Semaglutide,0.25 or 0.5MG /DOS, (OZEMPIC, 0.25 OR 0.5 MG/DOSE,) 2 MG/3ML SOPN; Inject 0.5 mg into the skin once a week. -     Ambulatory referral to  Cardiology  Pre-diabetes -     CMP14+EGFR -     TSH -     Hemoglobin A1c -     Semaglutide,0.25 or 0.5MG /DOS, (OZEMPIC, 0.25 OR 0.5 MG/DOSE,) 2 MG/3ML SOPN; Inject 0.25 mg into the skin once a week. -     Semaglutide,0.25 or 0.5MG /DOS, (OZEMPIC, 0.25 OR 0.5 MG/DOSE,) 2 MG/3ML SOPN; Inject 0.5 mg into the skin once a week. -     Ambulatory referral to Cardiology  Vitamin D deficiency -     VITAMIN D 25 Hydroxy (Vit-D Deficiency, Fractures)   Referral made for cardiology to follow up post MI and stent. Stay on plavix one year post stent.   Discussed need for weight loss with BMI increasing and high risk co-morbidities(stent/CAD/HLD/pre-diabetes/hx of MI)  Start ozempic .25mg  weekly then increase to .5mg  weekly  Follow up in 3 months Discussed SE.  Goal exercise is 150 minutes a week.   Fasting labs ordered  Refilled metoprolol and losartan.  Continue ASA and plavix.  Continue crestor.     Follow Up Instructions:    I discussed the assessment and treatment plan with the patient. The patient was provided an opportunity to ask questions and all were answered. The patient agreed with the plan and demonstrated an understanding of the instructions.   The patient was advised to call back  or seek an in-person evaluation if the symptoms worsen or if the condition fails to improve as anticipated.    Tandy Gaw, PA-C

## 2022-10-07 NOTE — Progress Notes (Signed)
An attempt to reach was made at 10/07/22 at 10:14 a detailed vm was left for pt to return call back to clinic  Pt would like to discuss weight loss medication , pt states she want labs for cholesterol

## 2022-10-09 ENCOUNTER — Telehealth: Payer: Self-pay

## 2022-10-09 NOTE — Progress Notes (Signed)
What about adding zetia or once every 14 days injection to get cholesterol to goal. Are you ok with trying one of these?

## 2022-10-09 NOTE — Telephone Encounter (Signed)
Spoke with patient during a test result call. She states she did not receive a voice mail on 10/07/2022 and is requesting a return call again to number in chart.  In speaking with her it sounds like this is in regards to a prior authorization for a weight loss medication.   Oval Linsey, CMA  Certified Medical Assistant   Progress Notes    Signed   Encounter Date: 10/07/2022   Signed      An attempt to reach was made at 10/07/22 at 10:14 a detailed vm was left for pt to return call back to clinic  Pt would like to discuss weight loss medication , pt states she want labs for cholesterol        Electronically signed by Oval Linsey, CMA at 10/07/2022 12:04 PM

## 2022-10-09 NOTE — Progress Notes (Signed)
Olivia Miller,   A1C continues in pre-diabetes range and up just a hair from 4 years ago.  Avoiding sugars/carbs and getting 150 minutes of exercise a week.  Recheck in 6 months.  Kidney, liver look good.  Vitamin D looks great.  Thyroid looks good.  HDL, good cholesterol, improving.  LDL not to optimal level especially being on crestor. You are taking crestor daily, correct? If you are we should consider another medication to get your to LDL goal of under 70. Thoughts?

## 2022-10-10 MED ORDER — EZETIMIBE 10 MG PO TABS: 10.0000 mg | ORAL_TABLET | Freq: Every day | ORAL | 3 refills | Status: DC

## 2022-10-10 NOTE — Addendum Note (Signed)
Addended by: Jomarie Longs on: 10/10/2022 09:30 AM   Modules accepted: Orders

## 2022-10-15 ENCOUNTER — Ambulatory Visit (INDEPENDENT_AMBULATORY_CARE_PROVIDER_SITE_OTHER): Payer: BLUE CROSS/BLUE SHIELD | Admitting: Family Medicine

## 2022-10-15 ENCOUNTER — Encounter: Payer: Self-pay | Admitting: Family Medicine

## 2022-10-15 VITALS — BP 132/82 | HR 81 | Resp 18 | Ht 62.0 in | Wt 196.0 lb

## 2022-10-15 DIAGNOSIS — J0100 Acute maxillary sinusitis, unspecified: Secondary | ICD-10-CM | POA: Diagnosis not present

## 2022-10-15 DIAGNOSIS — R42 Dizziness and giddiness: Secondary | ICD-10-CM

## 2022-10-15 MED ORDER — AMOXICILLIN-POT CLAVULANATE 875-125 MG PO TABS: 1.0000 | ORAL_TABLET | Freq: Two times a day (BID) | ORAL | 0 refills | Status: AC

## 2022-10-15 MED ORDER — MECLIZINE HCL 25 MG PO TABS
25.0000 mg | ORAL_TABLET | Freq: Three times a day (TID) | ORAL | 0 refills | Status: AC | PRN
Start: 2022-10-15 — End: ?

## 2022-10-15 NOTE — Progress Notes (Signed)
   Acute Office Visit  Subjective:     Patient ID: Olivia Miller, female    DOB: 1960/08/28, 62 y.o.   MRN: [id number]  Chief Complaint  Patient presents with   Dizziness    Right side and eye pain x2days    HPI Patient is in today for acute dizziness. She is also having concerns of sinus pain and pressure.   She has had dizziness in the past and meclizine worked for her.   Review of Systems  Constitutional:  Negative for chills and fever.  Respiratory:  Negative for cough and shortness of breath.   Cardiovascular:  Negative for chest pain.  Neurological:  Positive for dizziness. Negative for headaches.        Objective:    BP 132/82 (BP Location: Left Arm, Patient Position: Sitting, Cuff Size: Large)   Pulse 81   Resp 18   Ht 5\' 2"  (1.575 m)   Wt 196 lb (88.9 kg)   LMP 05/21/2015 (Within Days)   SpO2 100%   BMI 35.85 kg/m    Physical Exam Vitals and nursing note reviewed.  Constitutional:      General: She is not in acute distress.    Appearance: Normal appearance.  HENT:     Head: Normocephalic and atraumatic.     Right Ear: External ear normal.     Left Ear: External ear normal.     Nose: Nose normal.  Eyes:     Conjunctiva/sclera: Conjunctivae normal.  Cardiovascular:     Rate and Rhythm: Normal rate.  Pulmonary:     Effort: Pulmonary effort is normal.  Neurological:     General: No focal deficit present.     Mental Status: She is alert and oriented to person, place, and time.  Psychiatric:        Mood and Affect: Mood normal.        Behavior: Behavior normal.        Thought Content: Thought content normal.        Judgment: Judgment normal.     No results found for any visits on 10/15/22.      Assessment & Plan:   Problem List Items Addressed This Visit       Respiratory   Acute maxillary sinusitis    - given augmentin for sinus infection, likely causing inner ear pressure and vertigo      Relevant Medications    amoxicillin-clavulanate (AUGMENTIN) 875-125 MG tablet     Other   Vertigo - Primary    - no red flag symptoms - will do a trial of meclizine  - wondering if sinus infection has caused pressure in her inner ear and that is what is causing the vertigo - if no better, can try ENT for vestibular therapy      Relevant Medications   meclizine (ANTIVERT) 25 MG tablet    Meds ordered this encounter  Medications   meclizine (ANTIVERT) 25 MG tablet    Sig: Take 1 tablet (25 mg total) by mouth 3 (three) times daily as needed for dizziness.    Dispense:  30 tablet    Refill:  0   amoxicillin-clavulanate (AUGMENTIN) 875-125 MG tablet    Sig: Take 1 tablet by mouth 2 (two) times daily for 5 days.    Dispense:  10 tablet    Refill:  0    No follow-ups on file.  Charlton Amor, DO

## 2022-10-16 DIAGNOSIS — R42 Dizziness and giddiness: Secondary | ICD-10-CM | POA: Insufficient documentation

## 2022-10-16 DIAGNOSIS — J0100 Acute maxillary sinusitis, unspecified: Secondary | ICD-10-CM | POA: Insufficient documentation

## 2022-10-16 NOTE — Assessment & Plan Note (Signed)
-   given augmentin for sinus infection, likely causing inner ear pressure and vertigo

## 2022-10-16 NOTE — Assessment & Plan Note (Signed)
-   no red flag symptoms - will do a trial of meclizine  - wondering if sinus infection has caused pressure in her inner ear and that is what is causing the vertigo - if no better, can try ENT for vestibular therapy

## 2022-10-17 ENCOUNTER — Telehealth: Payer: Self-pay

## 2022-10-17 NOTE — Telephone Encounter (Addendum)
Initiated Prior authorization [id number] (0.25 or 0.5 MG/DOSE) 2MG /3ML pen-injectors Via: Covermymeds Case/Key: [id number] Status: denied  as of 10/17/22 Reason:criteria not met Notified Pt via: Mychart

## 2022-10-23 ENCOUNTER — Telehealth: Payer: BLUE CROSS/BLUE SHIELD | Admitting: Family Medicine

## 2022-10-25 ENCOUNTER — Encounter: Payer: Self-pay | Admitting: Physician Assistant

## 2022-10-25 ENCOUNTER — Ambulatory Visit (INDEPENDENT_AMBULATORY_CARE_PROVIDER_SITE_OTHER): Payer: BLUE CROSS/BLUE SHIELD | Admitting: Physician Assistant

## 2022-10-25 VITALS — BP 138/76 | HR 57 | Ht 62.0 in | Wt 199.0 lb

## 2022-10-25 DIAGNOSIS — R519 Headache, unspecified: Secondary | ICD-10-CM | POA: Insufficient documentation

## 2022-10-25 DIAGNOSIS — G510 Bell's palsy: Secondary | ICD-10-CM

## 2022-10-25 DIAGNOSIS — H539 Unspecified visual disturbance: Secondary | ICD-10-CM | POA: Diagnosis not present

## 2022-10-25 DIAGNOSIS — R42 Dizziness and giddiness: Secondary | ICD-10-CM | POA: Insufficient documentation

## 2022-10-25 NOTE — Progress Notes (Signed)
Established Patient Office Visit  Subjective   Patient ID: Olivia Miller, female    DOB: 1960/08/30  Age: 62 y.o. MRN: [id number]  Chief Complaint  Patient presents with   Hospitalization Follow-up    [facility] for Bells pasly    HPI Pt is a 62 yo obese female with NSTEMI, HTN, HLD who presents to the clinic to follow up from ED visit on 10/16/2022 for Bells Palsy. No imaging or labs done. Pt is concerned and feels like her left eye is starting to feel like how her right eye feels. Before the palsy she did have sinus infection. She denies any rash. She was given prednisone and valtrex. It does not seem to be helping. She continues to have some dizziness, temple headaches, blurry vision. No upper ext weakness. She is worried about being given valtrex and wants to know if I think she has herpes that is doing this to her.   .. Active Ambulatory Problems    Diagnosis Date Noted   Dyslipidemia 03/03/2015   Tachycardia 03/03/2015   Left cervical radiculopathy 03/03/2015   OAB (overactive bladder) 03/03/2015   DDD (degenerative disc disease), cervical 03/03/2015   Calcification of right carotid artery 06/07/2015   Snoring 04/02/2016   Apneic episode 04/02/2016   Non-restorative sleep 04/02/2016   Clostridium difficile colitis 04/18/2016   Breast mass, right 04/23/2016   Tinea pedis of left foot 08/06/2016   Fibromyalgia 03/10/2017   Nasal congestion 03/10/2017   Palpitations 03/10/2017   Esophageal dysphagia 03/10/2017   Class 1 obesity due to excess calories with serious comorbidity and body mass index (BMI) of 34.0 to 34.9 in adult 03/12/2017   Family history of bone cancer 03/12/2017   Chronic bilateral thoracic back pain 03/12/2017   Chronic bilateral low back pain with left-sided sciatica 03/12/2017   Female cystocele 02/04/2018   Rectocele 02/04/2018   Chronic pain of both knees 02/04/2018   Primary osteoarthritis of knee 02/04/2018   Plantar fasciitis of right foot 02/16/2018    Irritant contact dermatitis due to detergent 07/15/2018   Onychomycosis 07/15/2018   Vitamin D insufficiency 07/15/2018   Cough 03/25/2019   Gastroesophageal reflux disease 03/25/2019   Wheezing 03/25/2019   Chronic rhinitis 03/25/2019   Neck pain 04/05/2019   Elevated blood pressure reading 04/05/2019   Pre-diabetes 04/05/2019   Atypical chest pain 04/07/2019   Hiatal hernia 04/12/2011   Asthmatic bronchitis 03/21/2020   COVID-19 virus infection 03/21/2020   DDD (degenerative disc disease), lumbar 11/14/2020   Multiple skin nodules 11/15/2020   Statin intolerance 08/13/2021   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   Mixed hyperlipidemia 08/13/2021   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   Class 2 severe obesity due to excess calories with serious comorbidity and body mass index (BMI) of 36.0 to 36.9 in adult [facility]) 05/21/2022   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/21/2022   NSTEMI (non-ST elevated myocardial infarction) (HCC) 05/21/2022   Primary hypertension 05/21/2022   Itching 05/21/2022   Medication side effects 05/21/2022   Generalized abdominal pain 05/21/2022   Sinobronchitis 06/28/2022   Sore throat 06/28/2022   Class 2 obesity due to excess calories without serious comorbidity with body mass index (BMI) of 36.0 to 36.9 in adult 10/07/2022   Prolapse of female pelvic organs 10/21/2012   Urinary incontinence 10/21/2012   Vertigo 10/16/2022   Acute maxillary sinusitis 10/16/2022   Acute nonintractable headache 10/25/2022   Dizziness 10/25/2022   Bell's palsy 10/25/2022   Vision changes 10/25/2022  Resolved Ambulatory Problems    Diagnosis Date Noted   Cervical polyp 08/06/2016   Past Medical History:  Diagnosis Date   GERD (gastroesophageal reflux disease)    Hyperlipidemia      ROS See HPI.    Objective:     BP 138/76   Pulse (!) 57   Ht 5\' 2"  (1.575 m)   Wt 199 lb (90.3 kg)   LMP 05/21/2015 (Within Days)   SpO2 99%   BMI  36.40 kg/m  BP Readings from Last 3 Encounters:  10/25/22 138/76  10/15/22 132/82  06/28/22 113/82   Wt Readings from Last 3 Encounters:  10/25/22 199 lb (90.3 kg)  10/15/22 196 lb (88.9 kg)  10/07/22 205 lb (93 kg)      Physical Exam Vitals reviewed.  Constitutional:      Appearance: Normal appearance. She is obese.  HENT:     Head: Normocephalic.     Right Ear: Tympanic membrane, ear canal and external ear normal. There is no impacted cerumen.     Left Ear: Tympanic membrane, ear canal and external ear normal. There is no impacted cerumen.     Nose: Nose normal.     Mouth/Throat:     Mouth: Mucous membranes are moist.     Pharynx: No oropharyngeal exudate or posterior oropharyngeal erythema.  Eyes:     Comments: Watery conjunctiva  Cardiovascular:     Rate and Rhythm: Normal rate.  Pulmonary:     Effort: Pulmonary effort is normal.  Musculoskeletal:     Cervical back: Normal range of motion and neck supple. No tenderness.     Right lower leg: No edema.     Left lower leg: No edema.  Lymphadenopathy:     Cervical: No cervical adenopathy.  Neurological:     Mental Status: She is alert and oriented to person, place, and time.     Gait: Gait normal.     Comments: Facial paralysis of right side of face  Psychiatric:        Mood and Affect: Mood normal.        Assessment & Plan:  Marland KitchenMarland KitchenLyne was seen today for hospitalization follow-up.  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 MRI stat ordered today Will get labs-she is concerned about herpes playing a role in palsy due to ED giving her valtrex. I discussed how viruses in general can play a part in bells palsy thus valtrex can help  treat. She has no evidence of having herpes.  Labs ordered to look for antibodies    Tandy Gaw, PA-C

## 2022-10-25 NOTE — Patient Instructions (Signed)
Bell's Palsy, Adult  Bell's palsy is a short-term inability to move muscles in a part of the face. The inability to move, also called paralysis, results from inflammation or compression of the seventh cranial nerve. This nerve travels along the skull and under the ear to the side of the face. This nerve is responsible for facial movements that include blinking, closing the eyes, smiling, and frowning. What are the causes? The exact cause of this condition is not known. It may be caused by an infection from a virus, such as the chickenpox (herpes zoster), Epstein-Barr, or mumps virus. What increases the risk? You are more likely to develop this condition if: You are pregnant. You have diabetes. You have had a recent infection in your nose, throat, or airways. You have a weakened body defense system (immune system). You have had a facial injury, such as a fracture. You have a family history of Bell's palsy. What are the signs or symptoms? Symptoms of this condition include: Weakness on one side of the face. Drooping eyelid and corner of the mouth. Excessive tearing in one eye. Difficulty closing the eyelid. Dry eye. Drooling. Dry mouth. Changes in taste. Change in facial appearance. Pain behind one ear. Ringing in one or both ears. Sensitivity to sound in one ear. Facial twitching. Headache. Impaired speech. Dizziness. Difficulty eating or drinking. Most of the time, only one side of the face is affected. In rare cases, Bell's palsy may affect the whole face. How is this diagnosed? This condition is diagnosed based on: Your symptoms. Your medical history. A physical exam. You may also have to see health care providers who specialize in disorders of the nerves (neurologist) or diseases and conditions of the eye (ophthalmologist). You may have tests, such as: A test to check for nerve damage (electromyogram). Imaging studies, such as a CT scan or an MRI. Blood tests. How is this  treated? This condition affects every person differently. Sometimes symptoms go away without treatment within a couple weeks. If treatment is needed, it varies from person to person. The goal of treatment is to reduce inflammation and protect the eye from damage. Treatment for Bell's palsy may include: Medicines, such as: Steroids to reduce swelling and inflammation. Antiviral medicines. Pain relievers, including aspirin, acetaminophen, or ibuprofen. Eye drops or ointment to keep your eye moist. Eye protection, if you cannot close your eye. Exercises or massage to regain muscle strength and function (physical therapy). Follow these instructions at home:  Take over-the-counter and prescription medicines only as told by your health care provider. If your eye is affected: Keep your eye moist with eye drops or ointment as told by your health care provider. Follow instructions for eye care and protection as told by your health care provider. Do any physical therapy exercises as told by your health care provider. Keep all follow-up visits. This is important. Contact a health care provider if: You have a fever or chills. Your symptoms do not get better within 2-3 weeks, or your symptoms get worse. Your eye is red, irritated, or painful. You have new symptoms. Get help right away if: You have weakness or numbness in a part of your body other than your face. You have trouble swallowing. You develop neck pain or stiffness. You develop dizziness or shortness of breath. Summary Bell's palsy is a short-term inability to move muscles in a part of the face. The inability to move results from inflammation or compression of the facial nerve. This condition affects every person  differently. Sometimes symptoms go away without treatment within a couple weeks. If treatment is needed, it varies from person to person. The goal of treatment is to reduce inflammation and protect the eye from damage. Contact  your health care provider if your symptoms do not get better within 2-3 weeks, or your symptoms get worse. This information is not intended to replace advice given to you by your health care provider. Make sure you discuss any questions you have with your health care provider. Document Revised: 11/25/2019 Document Reviewed: 11/25/2019 Elsevier Patient Education  2024 [employer].

## 2022-10-26 LAB — CMP14+EGFR
ALT: 104 IU/L — ABNORMAL HIGH (ref 0–32)
AST: 89 IU/L — ABNORMAL HIGH (ref 0–40)
Albumin: 3.9 g/dL (ref 3.9–4.9)
Alkaline Phosphatase: 78 IU/L (ref 44–121)
BUN/Creatinine Ratio: 18 (ref 12–28)
BUN: 17 mg/dL (ref 8–27)
Bilirubin Total: 0.3 mg/dL (ref 0.0–1.2)
CO2: 26 mmol/L (ref 20–29)
Calcium: 8.9 mg/dL (ref 8.7–10.3)
Chloride: 101 mmol/L (ref 96–106)
Creatinine, Ser: 0.95 mg/dL (ref 0.57–1.00)
Globulin, Total: 2.2 g/dL (ref 1.5–4.5)
Glucose: 81 mg/dL (ref 70–99)
Potassium: 4.3 mmol/L (ref 3.5–5.2)
Sodium: 142 mmol/L (ref 134–144)
Total Protein: 6.1 g/dL (ref 6.0–8.5)
eGFR: 68 mL/min/{1.73_m2} (ref >59)

## 2022-10-26 LAB — TSH: TSH: 3.51 u[IU]/mL (ref 0.450–4.500)

## 2022-10-26 LAB — HSV 1 AND 2 AB, IGG
HSV 1 Glycoprotein G Ab, IgG: 17.7 {index} — ABNORMAL HIGH (ref 0.00–0.90)
HSV 2 IgG, Type Spec: 11.4 {index} — ABNORMAL HIGH (ref 0.00–0.90)

## 2022-10-28 NOTE — Progress Notes (Signed)
Thyroid function normal range.  Liver enzymes up but we can see this with viruses which likely caused your Bells Palsy.  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

## 2022-10-29 ENCOUNTER — Ambulatory Visit: Payer: BLUE CROSS/BLUE SHIELD | Admitting: Physician Assistant

## 2022-11-15 ENCOUNTER — Telehealth: Payer: Self-pay | Admitting: *Deleted

## 2022-11-15 NOTE — Telephone Encounter (Signed)
Left patient a message to call and schedule annual. Advised patient that [employer] is out of network with [facility].

## 2022-12-02 ENCOUNTER — Telehealth: Payer: Self-pay | Admitting: Physician Assistant

## 2022-12-02 NOTE — Telephone Encounter (Signed)
Patient called to follow up on Ozempic 0.25 or 05mg  dose she would like a call back please [phone] Pharmacy [facility] [place]  Phone number [phone]

## 2022-12-04 ENCOUNTER — Telehealth: Payer: Self-pay | Admitting: Physician Assistant

## 2022-12-04 NOTE — Telephone Encounter (Signed)
Patient states that she is okay with med change

## 2022-12-05 ENCOUNTER — Encounter: Payer: Self-pay | Admitting: Physician Assistant

## 2022-12-06 ENCOUNTER — Encounter: Payer: Self-pay | Admitting: Physician Assistant

## 2022-12-09 ENCOUNTER — Telehealth: Payer: Self-pay | Admitting: Physician Assistant

## 2022-12-09 MED ORDER — MOUNJARO 2.5 MG/0.5ML ~~LOC~~ SOAJ: 2.5000 mg | SUBCUTANEOUS | 0 refills | Status: DC

## 2022-12-09 NOTE — Telephone Encounter (Signed)
Chris from [facility] called wanting to clarify on which way to process request for mounjaro. Should he run it through insurance along with discount coupon, if so a prior Berkley Harvey is needed. Or is it going to be paid for out of pocket with the discount coupon. Please advise.

## 2022-12-10 MED ORDER — ZEPBOUND 2.5 MG/0.5ML ~~LOC~~ SOAJ
2.5000 mg | SUBCUTANEOUS | 0 refills | Status: DC
Start: 2022-12-10 — End: 2023-01-06

## 2022-12-10 NOTE — Addendum Note (Signed)
Addended by: Jomarie Longs on: 12/10/2022 01:51 PM   Modules accepted: Orders

## 2022-12-13 ENCOUNTER — Telehealth: Payer: BLUE CROSS/BLUE SHIELD | Admitting: Physician Assistant

## 2023-01-03 ENCOUNTER — Other Ambulatory Visit: Payer: Self-pay | Admitting: Physician Assistant

## 2023-01-06 MED ORDER — TIRZEPATIDE-WEIGHT MANAGEMENT 5 MG/0.5ML ~~LOC~~ SOLN: 5.0000 mg | SUBCUTANEOUS | 0 refills | Status: DC

## 2023-01-10 ENCOUNTER — Telehealth: Payer: BLUE CROSS/BLUE SHIELD | Admitting: Physician Assistant

## 2023-01-31 ENCOUNTER — Other Ambulatory Visit: Payer: Self-pay | Admitting: Physician Assistant

## 2023-01-31 MED ORDER — TIRZEPATIDE 7.5 MG/0.5ML ~~LOC~~ SOAJ
7.5000 mg | SUBCUTANEOUS | 0 refills | Status: DC
Start: 2023-01-31 — End: 2023-02-03

## 2023-02-03 ENCOUNTER — Telehealth: Payer: Self-pay

## 2023-02-03 MED ORDER — TIRZEPATIDE-WEIGHT MANAGEMENT 5 MG/0.5ML ~~LOC~~ SOLN: 5.0000 mg | SUBCUTANEOUS | 2 refills | Status: DC

## 2023-02-03 NOTE — Telephone Encounter (Signed)
Correct medication has been sent to the pharmacy

## 2023-02-03 NOTE — Telephone Encounter (Signed)
Hailey would like to stay on the same dose of the Zepbound. Sent referral.

## 2023-02-03 NOTE — Telephone Encounter (Signed)
Copied from CRM [id number]. Topic: Clinical - Prescription Issue >> Jan 31, 2023  4:01 PM [name] wrote: Reason for CRM: Patient called to advise that she submitted the wrong medication for refill. Patient submitted request for Olivia Miller when she actually takes Zepbound 5mg  1x per week via injection.   Patient has taken this week's injection. Please update medication list.

## 2023-02-03 NOTE — Telephone Encounter (Signed)
Copied from CRM [id number]. Topic: Clinical - Medication Question >> Jan 31, 2023  3:35 PM Danika B wrote: Reason for CRM: Patient called in to get clarification on why medication refill was denied through mychart. Stated that she is out of injections for her Zepbound(tirzepatide) 5MG . She also received a second message for denial for Mounjaro 7.5MG  which she says she is not on. Wants clarification and next steps to get rx filled.

## 2023-02-09 DIAGNOSIS — Z419 Encounter for procedure for purposes other than remedying health state, unspecified: Secondary | ICD-10-CM | POA: Diagnosis not present

## 2023-03-03 ENCOUNTER — Telehealth: Payer: Self-pay

## 2023-03-03 NOTE — Telephone Encounter (Signed)
Copied from CRM [id number]. Topic: Clinical - Medication Refill >> Feb 28, 2023  3:00 PM Alvino Blood C wrote: Most Recent Primary Care Visit:  Provider: Jomarie Longs  Department: [facility]  Visit Type: OFFICE VISIT  Date: 10/25/2022  Medication: Zepbound(tirzepatide) 5MG   PT is requesting an increase in the dosage 7.5MG   Has the patient contacted their pharmacy? Yes (Agent: If no, request that the patient contact the pharmacy for the refill. If patient does not wish to contact the pharmacy document the reason why and proceed with request.) (Agent: If yes, when and what did the pharmacy advise?)  Is this the correct pharmacy for this prescription? Yes If no, delete pharmacy and type the correct one.  This is the patient's preferred pharmacy:  [facility] - [place], [place] - [address] [place] [address] Phone: [phone] Fax: [phone]   Has the prescription been filled recently? No  Is the patient out of the medication? Yes  Has the patient been seen for an appointment in the las t year OR does the patient have an upcoming appointment? Yes  Can we respond through MyChart? Yes  Agent: Please be advised that Rx refills may take up to 3 business days. We ask that you follow-up with your pharmacy.

## 2023-03-03 NOTE — Telephone Encounter (Signed)
Requesting strength increase of tirzepatide to 7.5mg  Last written as 5mg  on 02/03/2023 Last OV 10/25/2022 Upcoming appt = none

## 2023-03-07 ENCOUNTER — Encounter: Payer: Self-pay | Admitting: Physician Assistant

## 2023-03-07 MED ORDER — ZEPBOUND 7.5 MG/0.5ML ~~LOC~~ SOAJ: 7.5000 mg | SUBCUTANEOUS | 1 refills | Status: DC

## 2023-03-07 NOTE — Telephone Encounter (Signed)
Copied from CRM [id number]. Topic: Clinical - Prescription Issue >> Mar 07, 2023 10:16 AM Olivia Miller wrote: Reason for CRM: Patient would like to start a PA  for Zepbound through new insurance. Patient states Medicaid may pay for it or an alternative. Patient states she needs to move up to 7.5mG  dosage. Plan ID: [id number] Insurance ID: [id number] N

## 2023-03-12 DIAGNOSIS — Z419 Encounter for procedure for purposes other than remedying health state, unspecified: Secondary | ICD-10-CM | POA: Diagnosis not present

## 2023-04-01 ENCOUNTER — Telehealth: Payer: Medicaid Other | Admitting: Physician Assistant

## 2023-04-01 VITALS — Ht 63.0 in | Wt 178.0 lb

## 2023-04-01 DIAGNOSIS — Z6831 Body mass index (BMI) 31.0-31.9, adult: Secondary | ICD-10-CM | POA: Diagnosis not present

## 2023-04-01 DIAGNOSIS — I214 Non-ST elevation (NSTEMI) myocardial infarction: Secondary | ICD-10-CM

## 2023-04-01 DIAGNOSIS — J069 Acute upper respiratory infection, unspecified: Secondary | ICD-10-CM | POA: Diagnosis not present

## 2023-04-01 DIAGNOSIS — J029 Acute pharyngitis, unspecified: Secondary | ICD-10-CM

## 2023-04-01 DIAGNOSIS — R7303 Prediabetes: Secondary | ICD-10-CM | POA: Diagnosis not present

## 2023-04-01 DIAGNOSIS — E6609 Other obesity due to excess calories: Secondary | ICD-10-CM

## 2023-04-01 DIAGNOSIS — M549 Dorsalgia, unspecified: Secondary | ICD-10-CM | POA: Diagnosis not present

## 2023-04-01 DIAGNOSIS — R6889 Other general symptoms and signs: Secondary | ICD-10-CM

## 2023-04-01 DIAGNOSIS — R059 Cough, unspecified: Secondary | ICD-10-CM

## 2023-04-01 DIAGNOSIS — E66812 Obesity, class 2: Secondary | ICD-10-CM | POA: Diagnosis not present

## 2023-04-01 LAB — POCT INFLUENZA A/B
Influenza A, POC: NEGATIVE
Influenza B, POC: NEGATIVE

## 2023-04-01 LAB — POCT RAPID STREP A (OFFICE): Rapid Strep A Screen: NEGATIVE

## 2023-04-01 LAB — POC COVID19 BINAXNOW: SARS Coronavirus 2 Ag: NEGATIVE

## 2023-04-01 MED ORDER — ZEPBOUND 10 MG/0.5ML ~~LOC~~ SOAJ
10.0000 mg | SUBCUTANEOUS | 0 refills | Status: DC
Start: 2023-04-01 — End: 2023-06-10

## 2023-04-01 MED ORDER — METHOCARBAMOL 500 MG PO TABS
500.0000 mg | ORAL_TABLET | Freq: Three times a day (TID) | ORAL | 0 refills | Status: AC
Start: 2023-04-01 — End: ?

## 2023-04-01 MED ORDER — DICLOFENAC SODIUM 1 % EX GEL
4.0000 g | Freq: Four times a day (QID) | CUTANEOUS | 0 refills | Status: AC
Start: 2023-04-01 — End: ?

## 2023-04-01 NOTE — Patient Instructions (Signed)

## 2023-04-01 NOTE — Progress Notes (Signed)
..Virtual Visit via Video Note  I connected with [name] on 04/04/23 at  8:10 AM EST by a video enabled telemedicine application and verified that I am speaking with the correct person using two identifiers.  Location: Patient: home Provider: clinic  .Marland KitchenParticipating in visit:  Patient: Olivia Miller Provider: Tandy Gaw PA-C    I discussed the limitations of evaluation and management by telemedicine and the availability of in person appointments. The patient expressed understanding and agreed to proceed.  History of Present Illness: Pt is a 63 yo female who calls into the clinic with multiple concerns.   She has had congestion, cough, sore throat and sinus pressure for 2 days. She report thick green sputum. She denies any fever, chills, body aches. She has not had flu or covid vaccination. No other sick contacts. She is gargling with hot salt water but no other medications.   She needs zepbound refilled. She has a new insurance and may need new PA. She is doing well on medication. No side effects. She is losing weight about 35lbs so far.   She is also having some left upper chest wall, back and rib pain. Sunday, 3 days ago, she leaned over and felt a pull and pop with pain. Denies any radicular pain. No strength changes. She is taking magnesium. Pain is a little better.    .. Active Ambulatory Problems    Diagnosis Date Noted   Dyslipidemia 03/03/2015   Tachycardia 03/03/2015   Left cervical radiculopathy 03/03/2015   OAB (overactive bladder) 03/03/2015   DDD (degenerative disc disease), cervical 03/03/2015   Calcification of right carotid artery 06/07/2015   Snoring 04/02/2016   Apneic episode 04/02/2016   Non-restorative sleep 04/02/2016   Clostridium difficile colitis 04/18/2016   Breast mass, right 04/23/2016   Tinea pedis of left foot 08/06/2016   Fibromyalgia 03/10/2017   Nasal congestion 03/10/2017   Palpitations 03/10/2017   Esophageal dysphagia 03/10/2017    Class 1 obesity due to excess calories with serious comorbidity and body mass index (BMI) of 34.0 to 34.9 in adult 03/12/2017   Family history of bone cancer 03/12/2017   Chronic bilateral thoracic back pain 03/12/2017   Chronic bilateral low back pain with left-sided sciatica 03/12/2017   Female cystocele 02/04/2018   Rectocele 02/04/2018   Chronic pain of both knees 02/04/2018   Primary osteoarthritis of knee 02/04/2018   Plantar fasciitis of right foot 02/16/2018   Irritant contact dermatitis due to detergent 07/15/2018   Onychomycosis 07/15/2018   Vitamin D insufficiency 07/15/2018   Cough 03/25/2019   Gastroesophageal reflux disease 03/25/2019   Wheezing 03/25/2019   Chronic rhinitis 03/25/2019   Neck pain 04/05/2019   Elevated blood pressure reading 04/05/2019   Pre-diabetes 04/05/2019   Atypical chest pain 04/07/2019   Hiatal hernia 04/12/2011   Asthmatic bronchitis 03/21/2020   COVID-19 virus infection 03/21/2020   DDD (degenerative disc disease), lumbar 11/14/2020   Multiple skin nodules 11/15/2020   Statin intolerance 08/13/2021   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   Mixed hyperlipidemia 08/13/2021   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   Class 2 severe obesity due to excess calories with serious comorbidity and body mass index (BMI) of 36.0 to 36.9 in adult [facility]) 05/21/2022   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/21/2022   NSTEMI (non-ST elevated myocardial infarction) (HCC) 05/21/2022   Primary hypertension 05/21/2022   Itching 05/21/2022   Medication side effects 05/21/2022   Generalized abdominal pain 05/21/2022   Sinobronchitis 06/28/2022  Sore throat 06/28/2022   Class 2 obesity due to excess calories without serious comorbidity with body mass index (BMI) of 36.0 to 36.9 in adult 10/07/2022   Prolapse of female pelvic organs 10/21/2012   Urinary incontinence 10/21/2012   Vertigo 10/16/2022   Acute nonintractable headache  10/25/2022   Dizziness 10/25/2022   Bell's palsy 10/25/2022   Vision changes 10/25/2022   Resolved Ambulatory Problems    Diagnosis Date Noted   Cervical polyp 08/06/2016   Acute maxillary sinusitis 10/16/2022   Past Medical History:  Diagnosis Date   GERD (gastroesophageal reflux disease)    Hyperlipidemia       Observations/Objective: No acute distress Pale appearance Productive cough  .[name] Today's Vitals   04/01/23 0814  Weight: 178 lb (80.7 kg)  Height: 5\' 3"  (1.6 m)   Body mass index is 31.53 kg/m.  .. Results for orders placed or performed in visit on 04/01/23  POCT Influenza A/B   Collection Time: 04/01/23 11:25 AM  Result Value Ref Range   Influenza A, POC Negative Negative   Influenza B, POC Negative Negative  POC COVID-19   Collection Time: 04/01/23 11:26 AM  Result Value Ref Range   SARS Coronavirus 2 Ag Negative Negative  POCT rapid strep A   Collection Time: 04/01/23 11:26 AM  Result Value Ref Range   Rapid Strep A Screen Negative Negative     Assessment and Plan: Marland KitchenMarland KitchenMckenzee was seen today for medical management of chronic issues.  Diagnoses and all orders for this visit:  Viral URI with cough  Flu-like symptoms -     POCT Influenza A/B -     POC COVID-19 -     POCT rapid strep A  Class 2 obesity due to excess calories without serious comorbidity with body mass index (BMI) of 36.0 to 36.9 in adult -     tirzepatide (ZEPBOUND) 10 MG/0.5ML Pen; Inject 10 mg into the skin once a week.  Upper back pain on left side -     diclofenac Sodium (VOLTAREN) 1 % GEL; Apply 4 g topically 4 (four) times daily. To affected joint. -     methocarbamol (ROBAXIN) 500 MG tablet; Take 1 tablet (500 mg total) by mouth 3 (three) times daily.  Pre-diabetes -     tirzepatide (ZEPBOUND) 10 MG/0.5ML Pen; Inject 10 mg into the skin once a week.  NSTEMI (non-ST elevated myocardial infarction) (HCC) -     tirzepatide (ZEPBOUND) 10 MG/0.5ML Pen; Inject 10 mg into the  skin once a week.   New insurance care updated  Needs PA on zepbound, sent new dose Pt is doing well. She is tolerating and losing weight.  Muscle relaxer given and voltaren gel Discuss exercises to start and consider formal PT  Came into office for flu/covid/strep and negative Discussed symptomatic care Follow up if not improving or symptoms worsening   Follow Up Instructions:    I discussed the assessment and treatment plan with the patient. The patient was provided an opportunity to ask questions and all were answered. The patient agreed with the plan and demonstrated an understanding of the instructions.   The patient was advised to call back or seek an in-person evaluation if the symptoms worsen or if the condition fails to improve as anticipated.    Tandy Gaw, PA-C

## 2023-04-02 ENCOUNTER — Encounter: Payer: Self-pay | Admitting: Physician Assistant

## 2023-04-04 ENCOUNTER — Ambulatory Visit: Payer: BLUE CROSS/BLUE SHIELD | Admitting: Physician Assistant

## 2023-04-10 ENCOUNTER — Encounter: Payer: Self-pay | Admitting: Family Medicine

## 2023-04-10 ENCOUNTER — Ambulatory Visit (INDEPENDENT_AMBULATORY_CARE_PROVIDER_SITE_OTHER): Payer: Medicaid Other | Admitting: Family Medicine

## 2023-04-10 VITALS — BP 115/90 | HR 111 | Temp 99.2°F | Ht 62.0 in | Wt 178.5 lb

## 2023-04-10 DIAGNOSIS — J9801 Acute bronchospasm: Secondary | ICD-10-CM

## 2023-04-10 DIAGNOSIS — R051 Acute cough: Secondary | ICD-10-CM | POA: Diagnosis not present

## 2023-04-10 DIAGNOSIS — J019 Acute sinusitis, unspecified: Secondary | ICD-10-CM

## 2023-04-10 DIAGNOSIS — B9689 Other specified bacterial agents as the cause of diseases classified elsewhere: Secondary | ICD-10-CM | POA: Diagnosis not present

## 2023-04-10 MED ORDER — ALBUTEROL SULFATE HFA 108 (90 BASE) MCG/ACT IN AERS: 2.0000 | INHALATION_SPRAY | Freq: Four times a day (QID) | RESPIRATORY_TRACT | 0 refills | Status: AC | PRN

## 2023-04-10 MED ORDER — METHYLPREDNISOLONE 4 MG PO TBPK: ORAL_TABLET | ORAL | 0 refills | Status: DC

## 2023-04-10 MED ORDER — HYDROCOD POLI-CHLORPHE POLI ER 10-8 MG/5ML PO SUER: 5.0000 mL | Freq: Two times a day (BID) | ORAL | 0 refills | Status: DC | PRN

## 2023-04-10 MED ORDER — AMOXICILLIN-POT CLAVULANATE 875-125 MG PO TABS: 1.0000 | ORAL_TABLET | Freq: Two times a day (BID) | ORAL | 0 refills | Status: AC

## 2023-04-10 NOTE — Assessment & Plan Note (Addendum)
-   at this point with over 10 days of symptoms and worsening congestion and sinus pain will go ahead and give augmentin, albuterol inhaler for bronchospasm, and cough medicine - pt also notes body aches so I have gone ahead and given medrol dose pack to help knock down some inflammation - if no better at completion of antibiotics we may need to consider blood work and cxr. Today on exam lung exam was completely clear.

## 2023-04-10 NOTE — Assessment & Plan Note (Signed)
Given tussionex for cough. Pmp reviewed and verified with no red flags Also given albuterol for bronchospasm

## 2023-04-10 NOTE — Progress Notes (Signed)
Acute Office Visit  Subjective:     Patient ID: Olivia Miller, female    DOB: 05-26-1960, 63 y.o.   MRN: [id number]  Chief Complaint  Patient presents with   Sore Throat    Body aches. Low grade fever x1-2 wks    HPI Patient is in today for about 10 days of cough, congestion and sinus pain. Was seen on a video visit after two days of symptoms and had testing done for covid, flu, and strep all that were negative. Reports body aches, congestion, and head pain. Also notes some tooth pain today. She does have a hx of sinus infections and thinks it may be time to go to ENT.   Review of Systems  Constitutional:  Negative for chills and fever.  HENT:  Positive for congestion and sinus pain.   Respiratory:  Positive for cough. Negative for shortness of breath.   Cardiovascular:  Negative for chest pain.  Neurological:  Negative for headaches.        Objective:    BP (!) 115/90 (BP Location: Left Arm, Patient Position: Sitting, Cuff Size: Large)   Pulse (!) 111   Temp 99.2 F (37.3 C) (Oral)   Ht 5\' 2"  (1.575 m)   Wt 178 lb 8 oz (81 kg)   LMP 05/21/2015 (Within Days)   SpO2 97%   BMI 32.65 kg/m    Physical Exam Vitals and nursing note reviewed.  Constitutional:      General: She is not in acute distress.    Appearance: Normal appearance.  HENT:     Head: Normocephalic and atraumatic.     Right Ear: Tympanic membrane, ear canal and external ear normal.     Left Ear: Tympanic membrane, ear canal and external ear normal.     Nose: Congestion present.     Mouth/Throat:     Pharynx: No oropharyngeal exudate or posterior oropharyngeal erythema.  Eyes:     Conjunctiva/sclera: Conjunctivae normal.  Cardiovascular:     Rate and Rhythm: Normal rate and regular rhythm.  Pulmonary:     Effort: Pulmonary effort is normal.     Breath sounds: Normal breath sounds.  Neurological:     General: No focal deficit present.     Mental Status: She is alert and oriented to person,  place, and time.  Psychiatric:        Mood and Affect: Mood normal.        Behavior: Behavior normal.        Thought Content: Thought content normal.        Judgment: Judgment normal.     No results found for any visits on 04/10/23.      Assessment & Plan:   Problem List Items Addressed This Visit       Respiratory   Acute bacterial sinusitis - Primary   - at this point with over 10 days of symptoms and worsening congestion and sinus pain will go ahead and give augmentin, albuterol inhaler for bronchospasm, and cough medicine - pt also notes body aches so I have gone ahead and given medrol dose pack to help knock down some inflammation - if no better at completion of antibiotics we may need to consider blood work and cxr. Today on exam lung exam was completely clear.       Relevant Medications   amoxicillin-clavulanate (AUGMENTIN) 875-125 MG tablet   methylPREDNISolone (MEDROL DOSEPAK) 4 MG TBPK tablet   albuterol (VENTOLIN HFA) 108 (90 Base) MCG/ACT inhaler  chlorpheniramine-HYDROcodone (TUSSIONEX) 10-8 MG/5ML   Other Relevant Orders   Ambulatory referral to ENT     Other   Cough   Given tussionex for cough. Pmp reviewed and verified with no red flags Also given albuterol for bronchospasm      Other Visit Diagnoses       Bronchospasm       Relevant Medications   methylPREDNISolone (MEDROL DOSEPAK) 4 MG TBPK tablet   albuterol (VENTOLIN HFA) 108 (90 Base) MCG/ACT inhaler       Meds ordered this encounter  Medications   amoxicillin-clavulanate (AUGMENTIN) 875-125 MG tablet    Sig: Take 1 tablet by mouth 2 (two) times daily for 5 days.    Dispense:  10 tablet    Refill:  0   methylPREDNISolone (MEDROL DOSEPAK) 4 MG TBPK tablet    Sig: Follow instructions on pill pack    Dispense:  21 tablet    Refill:  0   albuterol (VENTOLIN HFA) 108 (90 Base) MCG/ACT inhaler    Sig: Inhale 2 puffs into the lungs every 6 (six) hours as needed for wheezing or shortness of  breath.    Dispense:  34 g    Refill:  0   chlorpheniramine-HYDROcodone (TUSSIONEX) 10-8 MG/5ML    Sig: Take 5 mLs by mouth every 12 (twelve) hours as needed for cough (cough, will cause drowsiness.).    Dispense:  120 mL    Refill:  0    No follow-ups on file.  Charlton Amor, DO

## 2023-04-12 DIAGNOSIS — Z419 Encounter for procedure for purposes other than remedying health state, unspecified: Secondary | ICD-10-CM | POA: Diagnosis not present

## 2023-05-10 DIAGNOSIS — Z419 Encounter for procedure for purposes other than remedying health state, unspecified: Secondary | ICD-10-CM | POA: Diagnosis not present

## 2023-05-21 ENCOUNTER — Telehealth: Payer: Self-pay | Admitting: Physician Assistant

## 2023-05-21 NOTE — Telephone Encounter (Signed)
 Copied from CRM [id number]. Topic: Clinical - Medical Advice >> May 21, 2023  8:20 AM Nila Nephew wrote: Reason for CRM: Patient's periodontist calling in to request medical information regarding patient needs. Patient is scheduled for a deep cleaning with sedation today and caller would like to know if any prior medication is needed, if the medical history permits and clears her for a procedure with sedation, and any other pertinent information. C/B at [phone] [facility]. Appointment is scheduled for 10:20 AM.

## 2023-05-23 ENCOUNTER — Telehealth: Payer: Self-pay | Admitting: Physician Assistant

## 2023-05-23 NOTE — Telephone Encounter (Signed)
 Referral has been sent to [facility] in [place]. Thank you

## 2023-05-23 NOTE — Telephone Encounter (Unsigned)
 Copied from CRM [id number]. Topic: Referral - Status >> Apr 23, 2023  1:28 PM Carlatta H wrote: Reason for CRM: Patient had a referral sent to an ENT in [place] but she would like to see one in [place]//

## 2023-06-05 DIAGNOSIS — I2510 Atherosclerotic heart disease of native coronary artery without angina pectoris: Secondary | ICD-10-CM | POA: Diagnosis not present

## 2023-06-05 DIAGNOSIS — Z7982 Long term (current) use of aspirin: Secondary | ICD-10-CM | POA: Diagnosis not present

## 2023-06-05 DIAGNOSIS — I10 Essential (primary) hypertension: Secondary | ICD-10-CM | POA: Diagnosis not present

## 2023-06-05 DIAGNOSIS — Z7902 Long term (current) use of antithrombotics/antiplatelets: Secondary | ICD-10-CM | POA: Diagnosis not present

## 2023-06-05 DIAGNOSIS — E785 Hyperlipidemia, unspecified: Secondary | ICD-10-CM | POA: Diagnosis not present

## 2023-06-05 DIAGNOSIS — I252 Old myocardial infarction: Secondary | ICD-10-CM | POA: Diagnosis not present

## 2023-06-05 DIAGNOSIS — R079 Chest pain, unspecified: Secondary | ICD-10-CM | POA: Diagnosis not present

## 2023-06-05 DIAGNOSIS — Z955 Presence of coronary angioplasty implant and graft: Secondary | ICD-10-CM | POA: Diagnosis not present

## 2023-06-05 DIAGNOSIS — Z79899 Other long term (current) drug therapy: Secondary | ICD-10-CM | POA: Diagnosis not present

## 2023-06-05 DIAGNOSIS — R0789 Other chest pain: Secondary | ICD-10-CM | POA: Diagnosis not present

## 2023-06-05 DIAGNOSIS — E7841 Elevated Lipoprotein(a): Secondary | ICD-10-CM | POA: Diagnosis not present

## 2023-06-05 DIAGNOSIS — D709 Neutropenia, unspecified: Secondary | ICD-10-CM | POA: Diagnosis not present

## 2023-06-06 ENCOUNTER — Institutional Professional Consult (permissible substitution) (INDEPENDENT_AMBULATORY_CARE_PROVIDER_SITE_OTHER): Payer: BLUE CROSS/BLUE SHIELD

## 2023-06-06 DIAGNOSIS — I2510 Atherosclerotic heart disease of native coronary artery without angina pectoris: Secondary | ICD-10-CM | POA: Diagnosis not present

## 2023-06-06 DIAGNOSIS — I200 Unstable angina: Secondary | ICD-10-CM | POA: Diagnosis not present

## 2023-06-06 DIAGNOSIS — R0789 Other chest pain: Secondary | ICD-10-CM | POA: Diagnosis not present

## 2023-06-06 DIAGNOSIS — R079 Chest pain, unspecified: Secondary | ICD-10-CM | POA: Diagnosis not present

## 2023-06-06 DIAGNOSIS — I219 Acute myocardial infarction, unspecified: Secondary | ICD-10-CM | POA: Diagnosis not present

## 2023-06-07 DIAGNOSIS — R079 Chest pain, unspecified: Secondary | ICD-10-CM | POA: Diagnosis not present

## 2023-06-07 DIAGNOSIS — I25110 Atherosclerotic heart disease of native coronary artery with unstable angina pectoris: Secondary | ICD-10-CM | POA: Diagnosis not present

## 2023-06-07 DIAGNOSIS — R9439 Abnormal result of other cardiovascular function study: Secondary | ICD-10-CM | POA: Diagnosis not present

## 2023-06-07 DIAGNOSIS — Z955 Presence of coronary angioplasty implant and graft: Secondary | ICD-10-CM | POA: Diagnosis not present

## 2023-06-10 ENCOUNTER — Ambulatory Visit (INDEPENDENT_AMBULATORY_CARE_PROVIDER_SITE_OTHER): Admitting: Physician Assistant

## 2023-06-10 ENCOUNTER — Telehealth: Payer: Self-pay

## 2023-06-10 ENCOUNTER — Encounter: Payer: Self-pay | Admitting: Physician Assistant

## 2023-06-10 VITALS — BP 118/76 | HR 69 | Ht 62.0 in | Wt 171.8 lb

## 2023-06-10 DIAGNOSIS — J310 Chronic rhinitis: Secondary | ICD-10-CM

## 2023-06-10 DIAGNOSIS — E782 Mixed hyperlipidemia: Secondary | ICD-10-CM | POA: Diagnosis not present

## 2023-06-10 DIAGNOSIS — R053 Chronic cough: Secondary | ICD-10-CM

## 2023-06-10 DIAGNOSIS — Z09 Encounter for follow-up examination after completed treatment for conditions other than malignant neoplasm: Secondary | ICD-10-CM | POA: Diagnosis not present

## 2023-06-10 DIAGNOSIS — I214 Non-ST elevation (NSTEMI) myocardial infarction: Secondary | ICD-10-CM

## 2023-06-10 DIAGNOSIS — J329 Chronic sinusitis, unspecified: Secondary | ICD-10-CM

## 2023-06-10 DIAGNOSIS — R9439 Abnormal result of other cardiovascular function study: Secondary | ICD-10-CM | POA: Diagnosis not present

## 2023-06-10 DIAGNOSIS — H539 Unspecified visual disturbance: Secondary | ICD-10-CM | POA: Diagnosis not present

## 2023-06-10 DIAGNOSIS — E7841 Elevated Lipoprotein(a): Secondary | ICD-10-CM

## 2023-06-10 DIAGNOSIS — Z8679 Personal history of other diseases of the circulatory system: Secondary | ICD-10-CM | POA: Insufficient documentation

## 2023-06-10 DIAGNOSIS — Z1231 Encounter for screening mammogram for malignant neoplasm of breast: Secondary | ICD-10-CM

## 2023-06-10 DIAGNOSIS — E538 Deficiency of other specified B group vitamins: Secondary | ICD-10-CM | POA: Diagnosis not present

## 2023-06-10 DIAGNOSIS — G510 Bell's palsy: Secondary | ICD-10-CM

## 2023-06-10 DIAGNOSIS — R42 Dizziness and giddiness: Secondary | ICD-10-CM

## 2023-06-10 DIAGNOSIS — R519 Headache, unspecified: Secondary | ICD-10-CM

## 2023-06-10 DIAGNOSIS — Z955 Presence of coronary angioplasty implant and graft: Secondary | ICD-10-CM

## 2023-06-10 MED ORDER — OMEPRAZOLE 40 MG PO CPDR: 40.0000 mg | DELAYED_RELEASE_CAPSULE | Freq: Every day | ORAL | 2 refills | Status: DC

## 2023-06-10 MED ORDER — REPATHA 140 MG/ML ~~LOC~~ SOSY: 140.0000 mg | PREFILLED_SYRINGE | SUBCUTANEOUS | 11 refills | Status: AC

## 2023-06-10 MED ORDER — FAMOTIDINE 40 MG PO TABS: 40.0000 mg | ORAL_TABLET | Freq: Every day | ORAL | 1 refills | Status: AC

## 2023-06-10 NOTE — Patient Instructions (Addendum)
 Start omeprazole daily in the morning before breakfast.  Will make referral to GI.   Will make referral for ENT  Get mammogram.   We are trying to get repatha approved for cholesterol to use in combination with what you are already on.

## 2023-06-10 NOTE — Progress Notes (Addendum)
 Established Patient Office Visit  Subjective   Patient ID: Olivia Miller, female    DOB: [birth year]  Age: 63 y.o. MRN: [id number]  Chief Complaint  Patient presents with   Hospitalization Follow-up    HPI Pt is a 63 yo obese female who presents to the clinic for follow up after hospital Admission on 06/05/2023. For Chest pain with extensive cardiac history to include CAD status post DES to distal left circumflex, HLD. Patient had stress test in ED which showed some ischemia. Heart cath done on 06/07/23 with stable findings and started on imdur. Suggested PA for PSK9 for better lipid control. Discharged on 06/07/2023.   She is feeling better and has a cardiology appt coming up.   She has ENT and GI appt coming up.   Pt request mammogram to be ordered.   Pt continues to have headaches 1-2 times a week, vision changes, dizziness intermittemently. Hx of bells palsy.   .. Active Ambulatory Problems    Diagnosis Date Noted   Dyslipidemia 03/03/2015   Tachycardia 03/03/2015   Left cervical radiculopathy 03/03/2015   OAB (overactive bladder) 03/03/2015   DDD (degenerative disc disease), cervical 03/03/2015   Calcification of right carotid artery 06/07/2015   Snoring 04/02/2016   Apneic episode 04/02/2016   Non-restorative sleep 04/02/2016   Clostridium difficile colitis 04/18/2016   Breast mass, right 04/23/2016   Tinea pedis of left foot 08/06/2016   Fibromyalgia 03/10/2017   Nasal congestion 03/10/2017   Palpitations 03/10/2017   Esophageal dysphagia 03/10/2017   Class 1 obesity due to excess calories with serious comorbidity and body mass index (BMI) of 34.0 to 34.9 in adult 03/12/2017   Family history of bone cancer 03/12/2017   Chronic bilateral thoracic back pain 03/12/2017   Chronic bilateral low back pain with left-sided sciatica 03/12/2017   Female cystocele 02/04/2018   Rectocele 02/04/2018   Chronic pain of both knees 02/04/2018   Primary osteoarthritis of knee  02/04/2018   Plantar fasciitis of right foot 02/16/2018   Irritant contact dermatitis due to detergent 07/15/2018   Onychomycosis 07/15/2018   Vitamin D insufficiency 07/15/2018   Cough 03/25/2019   Gastroesophageal reflux disease 03/25/2019   Wheezing 03/25/2019   Chronic rhinitis 03/25/2019   Neck pain 04/05/2019   Elevated blood pressure reading 04/05/2019   Pre-diabetes 04/05/2019   Atypical chest pain 04/07/2019   Hiatal hernia 04/12/2011   Asthmatic bronchitis 03/21/2020   COVID-19 virus infection 03/21/2020   DDD (degenerative disc disease), lumbar 11/14/2020   Multiple skin nodules 11/15/2020   Statin intolerance 08/13/2021   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   Mixed hyperlipidemia 08/13/2021   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   Class 2 severe obesity due to excess calories with serious comorbidity and body mass index (BMI) of 36.0 to 36.9 in adult [facility]) 05/21/2022   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/21/2022   NSTEMI (non-ST elevated myocardial infarction) (HCC) 05/21/2022   Primary hypertension 05/21/2022   Itching 05/21/2022   Medication side effects 05/21/2022   Generalized abdominal pain 05/21/2022   Sinobronchitis 06/28/2022   Sore throat 06/28/2022   Class 2 obesity due to excess calories without serious comorbidity with body mass index (BMI) of 36.0 to 36.9 in adult 10/07/2022   Prolapse of female pelvic organs 10/21/2012   Urinary incontinence 10/21/2012   Vertigo 10/16/2022   Acute nonintractable headache 10/25/2022   Dizziness 10/25/2022   Bell's palsy 10/25/2022   Vision changes 10/25/2022   Acute bacterial sinusitis 04/10/2023  Positive cardiac stress test 06/10/2023   History of CAD (coronary artery disease) 06/10/2023   Elevated lipoprotein A level 06/10/2023   Recurrent sinusitis 06/10/2023   Resolved Ambulatory Problems    Diagnosis Date Noted   Cervical polyp 08/06/2016   Acute maxillary sinusitis  10/16/2022   Past Medical History:  Diagnosis Date   GERD (gastroesophageal reflux disease)    Hyperlipidemia      ROS   See HPI  Objective:     BP 118/76 (BP Location: Left Arm, Patient Position: Sitting, Cuff Size: Large)   Pulse 69   Ht 5\' 2"  (1.575 m)   Wt 171 lb 12 oz (77.9 kg)   LMP 05/21/2015 (Within Days)   SpO2 95%   BMI 31.41 kg/m  BP Readings from Last 3 Encounters:  06/10/23 118/76  04/10/23 (!) 115/90  10/25/22 138/76   Wt Readings from Last 3 Encounters:  06/10/23 171 lb 12 oz (77.9 kg)  04/10/23 178 lb 8 oz (81 kg)  04/01/23 178 lb (80.7 kg)      Physical Exam Constitutional:      Appearance: Normal appearance. She is obese.  HENT:     Head: Normocephalic.  Cardiovascular:     Rate and Rhythm: Normal rate and regular rhythm.  Pulmonary:     Effort: Pulmonary effort is normal.     Breath sounds: Normal breath sounds.  Musculoskeletal:     Right lower leg: No edema.     Left lower leg: No edema.  Neurological:     General: No focal deficit present.     Mental Status: She is alert and oriented to person, place, and time.  Psychiatric:        Mood and Affect: Mood normal.     Assessment & Plan:  Marland KitchenMarland KitchenEnnifer was seen today for hospitalization follow-up.  Diagnoses and all orders for this visit:  Hospital discharge follow-up  Positive cardiac stress test -     Evolocumab (REPATHA) 140 MG/ML SOSY; Inject 140 mg into the skin every 14 (fourteen) days.  History of CAD (coronary artery disease) -     Evolocumab (REPATHA) 140 MG/ML SOSY; Inject 140 mg into the skin every 14 (fourteen) days.  Mixed hyperlipidemia -     Evolocumab (REPATHA) 140 MG/ML SOSY; Inject 140 mg into the skin every 14 (fourteen) days.  Elevated lipoprotein A level -     Evolocumab (REPATHA) 140 MG/ML SOSY; Inject 140 mg into the skin every 14 (fourteen) days.  Recurrent sinusitis -     Ambulatory referral to ENT  Chronic rhinitis -     Ambulatory referral to  ENT  Chronic cough -     Ambulatory referral to ENT -     famotidine (PEPCID) 40 MG tablet; Take 1 tablet (40 mg total) by mouth at bedtime.  Visit for screening mammogram -     Cancel: MM 3D SCREENING MAMMOGRAM BILATERAL BREAST; Future -     Cancel: MM 3D DIAGNOSTIC MAMMOGRAM BILATERAL BREAST; Future  B12 deficiency  Stented coronary artery  NSTEMI (non-ST elevated myocardial infarction) (HCC)  Other orders -     Discontinue: omeprazole (PRILOSEC) 40 MG capsule; Take 1 capsule (40 mg total) by mouth daily. In the morning before breakfast.   Discussed suggestion of cardiology from hospital of repatha and how I 100 percent support with her current CV risk Repatha to start every 14 days Restart omeprazole Continue pepcid Referral to GI   Recurrent sinus infections Referral to ENT  Mammogram  order placed  MRI to evaluate bells palsy/HA/dizziness/headaches   Tandy Gaw, PA-C

## 2023-06-10 NOTE — Telephone Encounter (Signed)
 Pharmacy Patient Advocate Encounter   Received notification from CoverMyMeds that prior authorization for Repatha is required/requested.   Insurance verification completed.   The patient is insured through [facility] .   Per test claim: PA required; PA submitted to above mentioned insurance via CoverMyMeds Key/confirmation #/EOC [id number] Status is pending

## 2023-06-11 ENCOUNTER — Other Ambulatory Visit: Payer: Self-pay | Admitting: Physician Assistant

## 2023-06-11 ENCOUNTER — Other Ambulatory Visit (HOSPITAL_COMMUNITY): Payer: Self-pay

## 2023-06-11 DIAGNOSIS — N6322 Unspecified lump in the left breast, upper inner quadrant: Secondary | ICD-10-CM

## 2023-06-11 DIAGNOSIS — R9439 Abnormal result of other cardiovascular function study: Secondary | ICD-10-CM

## 2023-06-11 DIAGNOSIS — J310 Chronic rhinitis: Secondary | ICD-10-CM

## 2023-06-11 DIAGNOSIS — J329 Chronic sinusitis, unspecified: Secondary | ICD-10-CM

## 2023-06-11 DIAGNOSIS — Z8679 Personal history of other diseases of the circulatory system: Secondary | ICD-10-CM

## 2023-06-11 DIAGNOSIS — E7841 Elevated Lipoprotein(a): Secondary | ICD-10-CM

## 2023-06-11 DIAGNOSIS — Z1231 Encounter for screening mammogram for malignant neoplasm of breast: Secondary | ICD-10-CM

## 2023-06-11 DIAGNOSIS — R053 Chronic cough: Secondary | ICD-10-CM

## 2023-06-11 DIAGNOSIS — E782 Mixed hyperlipidemia: Secondary | ICD-10-CM

## 2023-06-11 NOTE — Telephone Encounter (Signed)
 Pharmacy Patient Advocate Encounter  Received notification from [facility] that Prior Authorization for Repatha has been APPROVED from 06/10/23 to 06/09/24. Ran test claim, Copay is $4.00. This test claim was processed through [facility]- copay amounts may vary at other pharmacies due to pharmacy/plan contracts, or as the patient moves through the different stages of their insurance plan.   PA #/Case ID/Reference #: [id number]

## 2023-06-12 ENCOUNTER — Other Ambulatory Visit: Payer: Self-pay | Admitting: Physician Assistant

## 2023-06-12 ENCOUNTER — Telehealth: Payer: Self-pay | Admitting: Physician Assistant

## 2023-06-12 DIAGNOSIS — R1013 Epigastric pain: Secondary | ICD-10-CM | POA: Diagnosis not present

## 2023-06-12 DIAGNOSIS — N6322 Unspecified lump in the left breast, upper inner quadrant: Secondary | ICD-10-CM

## 2023-06-12 DIAGNOSIS — J329 Chronic sinusitis, unspecified: Secondary | ICD-10-CM | POA: Diagnosis not present

## 2023-06-12 NOTE — Telephone Encounter (Signed)
 Copied from CRM [id number]. Topic: Referral - Question >> Jun 11, 2023  5:00 PM Antony Haste wrote: Reason for CRM: The patient spoke with the [facility] office regarding her referral to complete her breast diagnostic - mammogram. Jashae states she was advised to request for her PCP to send another referral for an ultrasound to be completed during her mammogram appointment.

## 2023-06-12 NOTE — Telephone Encounter (Signed)
 Copied from CRM [id number]. Topic: Referral - Request for Referral >> Jun 12, 2023 11:46 AM Carrielelia G wrote: Attn: Olivia Miller said she is so sorry but the [facility] center is being a little difficult.    So can you please just send my Mammo Referral to  [facility] in [place],  tele# [phone]    fax# [phone]  for the diagnostic Mammogram to include [place] .   Please advise.

## 2023-06-13 ENCOUNTER — Encounter: Payer: Self-pay | Admitting: Physician Assistant

## 2023-06-16 ENCOUNTER — Encounter: Payer: Self-pay | Admitting: Physician Assistant

## 2023-06-16 DIAGNOSIS — K449 Diaphragmatic hernia without obstruction or gangrene: Secondary | ICD-10-CM | POA: Diagnosis not present

## 2023-06-18 ENCOUNTER — Encounter: Payer: Self-pay | Admitting: Obstetrics and Gynecology

## 2023-06-18 ENCOUNTER — Other Ambulatory Visit (HOSPITAL_COMMUNITY)
Admission: RE | Admit: 2023-06-18 | Discharge: 2023-06-18 | Disposition: A | Discharge status: Home / Self Care | Source: Ambulatory Visit | Attending: Obstetrics and Gynecology | Admitting: Obstetrics and Gynecology

## 2023-06-18 ENCOUNTER — Ambulatory Visit: Admitting: Obstetrics and Gynecology

## 2023-06-18 ENCOUNTER — Telehealth: Payer: Self-pay | Admitting: Physician Assistant

## 2023-06-18 VITALS — BP 130/81 | HR 95 | Ht 62.0 in | Wt 169.0 lb

## 2023-06-18 DIAGNOSIS — Z01419 Encounter for gynecological examination (general) (routine) without abnormal findings: Secondary | ICD-10-CM

## 2023-06-18 DIAGNOSIS — N950 Postmenopausal bleeding: Secondary | ICD-10-CM

## 2023-06-18 NOTE — Telephone Encounter (Signed)
 Copied from CRM [id number]. Topic: Referral - Status >> Jun 18, 2023 10:37 AM Antony Haste wrote: Reason for CRM: The patient spoke with [facility] this morning regarding her upcoming mammogram on 05/30, they stated the ultrasound order has been received but not the referral for the diagnostic. The patient states she is able to see both orders through her chart but [facility] states they still have not received the diagnostic .PT requesting for someone to contact [facility] to gain further confirmation on this.

## 2023-06-18 NOTE — Progress Notes (Signed)
 ANNUAL EXAM Patient name: Olivia Miller MRN [id number]  Date of birth: 1960/11/09 Chief Complaint:   No chief complaint on file.  History of Present Illness:   Olivia Miller is a 63 y.o. [phone] female being seen today for a routine annual exam.   Current concerns: She has had some left breast pain and felt a knot that has come and gone. She has already seen Tandy Gaw for it and has an order for [facility] but it is not yet scheduled. She does plan to go.   She also notes PMB - 2 episodes ever, most recent was about 2 weeks ago and was limited to spotting.   Patient's last menstrual period was 05/21/2015 (within days).  Last pap: 11/2019. Results were: NILM w/ HRHPV negative. H/O abnormal pap: no Last MXR: 06/2021  Health Maintenance Due  Topic Date Due   Pneumococcal Vaccine 54-3 Years old (1 of 2 - PCV) Never done   Zoster Vaccines- Shingrix (1 of 2) Never done   COVID-19 Vaccine (1 - 2024-25 season) Never done    Review of Systems:   Pertinent items are noted in HPI Denies any headaches, blurred vision, fatigue, shortness of breath, chest pain, abdominal pain, abnormal vaginal discharge/itching/odor/irritation, problems with periods, bowel movements, urination, or intercourse unless otherwise stated above.  Pertinent History Reviewed:  Reviewed past medical,surgical, social and family history.  Reviewed problem list, medications and allergies. Physical Assessment:   Vitals:   06/18/23 1515  BP: 130/81  Pulse: 95  Weight: 169 lb (76.7 kg)  Height: 5\' 2"  (1.575 m)   Body mass index is 30.91 kg/m.   Physical Examination:  General appearance - well appearing, and in no distress Mental status - alert, oriented to person, place, and time Psych:  She has a normal mood and affect Skin - warm and dry, normal color, no suspicious lesions noted Chest - effort normal Heart - normal rate  Breasts - breasts appear normal, no suspicious masses, no skin or nipple changes or  axillary nodes Abdomen - soft, nontender, nondistended, no masses or organomegaly Pelvic -  VULVA: normal appearing vulva with no masses, tenderness or lesions  VAGINA: normal appearing vagina with normal color and discharge, no lesions  CERVIX: normal appearing cervix without discharge or lesions, no CMT UTERUS: uterus is felt to be normal size, shape, consistency and nontender  ADNEXA: No adnexal masses or tenderness noted. Extremities:  No swelling or varicosities noted  Chaperone present for exam  No results found for this or any previous visit (from the past 24 hours).  Assessment & Plan:  Kanani was seen today for annual exam.  Diagnoses and all orders for this visit:  Encounter for annual routine gynecological examination - Cervical cancer screening: Discussed guidelines. Pap with HPV done - Breast Health: Encouraged self breast awareness/SBE. Discussed limits of clinical breast exam for detecting breast cancer. Discussed importance of annual MXR. Due for mxr -ordered by [facility]. Discussed with pt - she plans to go.  - Climacteric/Sexual health: Reviewed typical and atypical symptoms of menopause/peri-menopause. Discussed PMB and to call if any amount of spotting.  - Colonoscopy: 2021 - F/U 12 months and prn  PMB - Check pelvic US - Have low threshold for EMB. Reviewed with Olivia Miller even if [place] is normal but she has persistent PMB, I would recommend she let me know and we would bring her in for endometrial biopsy.   Orders Placed This Encounter  Procedures   US PELVIC COMPLETE WITH TRANSVAGINAL  Meds: No orders of the defined types were placed in this encounter.   Follow-up: Return in about 1 year (around 06/17/2024) for annual.  Milas Hock, MD 06/18/2023 3:32 PM

## 2023-06-21 DIAGNOSIS — Z419 Encounter for procedure for purposes other than remedying health state, unspecified: Secondary | ICD-10-CM | POA: Diagnosis not present

## 2023-06-21 NOTE — Progress Notes (Signed)
 Referring-Jade Breeback PA-C Reason for referral-CAD and chest pain  HPI: 63 yo female for evaluation of CAD and chest pain at request of Sandy Crumb PA-C. Carotid dopplers 2021 showed 1-39 bilateral stenosis. Had PCI of Lcx 2/24 in setting of NSTEMI. Previous evaluation at [facility] 3/25 with nuclear study showed EF 54 and mild lateral ischemia and TID. Echo showed normal LV function. Cardiac cath showed 40 mid LAD (unchanged compared to 2024) and patent stent in Lcx; minimal irregularities in RCA.  Laboratories March 2025 showed lipoprotein little A1 131.8; total cholesterol 238 with LDL 158.  UGI 4/25 showed small hiatal hernia; no reflux noted.  She now presents for second opinion.  At present she denies dyspnea on exertion, orthopnea, PND, pedal edema, exertional chest pain or syncope.  Current Outpatient Medications  Medication Sig Dispense Refill   albuterol  (VENTOLIN  HFA) 108 (90 Base) MCG/ACT inhaler Inhale 2 puffs into the lungs every 6 (six) hours as needed for wheezing or shortness of breath. 34 g 0   aspirin  81 MG chewable tablet Chew 1 tablet (81 mg total) by mouth daily. 90 tablet 3   Cholecalciferol (VITAMIN D -3) 5000 units TABS Take by mouth.     clopidogrel  (PLAVIX ) 75 MG tablet Take 1 tablet (75 mg total) by mouth daily. 90 tablet 1   Cyanocobalamin (B-12) 1000 MCG CAPS Take 1 tablet by mouth daily.     diclofenac  Sodium (VOLTAREN ) 1 % GEL Apply 4 g topically 4 (four) times daily. To affected joint. 100 g 0   Evolocumab  (REPATHA ) 140 MG/ML SOSY Inject 140 mg into the skin every 14 (fourteen) days. 2 mL 11   ezetimibe  (ZETIA ) 10 MG tablet Take 1 tablet (10 mg total) by mouth daily. 90 tablet 3   famotidine  (PEPCID ) 40 MG tablet Take 1 tablet (40 mg total) by mouth at bedtime. 90 tablet 1   isosorbide mononitrate (IMDUR) 30 MG 24 hr tablet Take 1 tablet by mouth daily.     losartan  (COZAAR ) 25 MG tablet Take 0.5 tablets (12.5 mg total) by mouth daily. 45 tablet 1   meclizine   (ANTIVERT ) 25 MG tablet Take 1 tablet (25 mg total) by mouth 3 (three) times daily as needed for dizziness. 30 tablet 0   methocarbamol  (ROBAXIN ) 500 MG tablet Take 1 tablet (500 mg total) by mouth 3 (three) times daily. 90 tablet 0   metoprolol  succinate (TOPROL -XL) 25 MG 24 hr tablet Take 1 tablet (25 mg total) by mouth daily. 90 tablet 1   rosuvastatin  (CRESTOR ) 40 MG tablet Take 1 tablet (40 mg total) by mouth daily. 90 tablet 0   nitroGLYCERIN (NITROSTAT) 0.4 MG SL tablet Place under the tongue.     No current facility-administered medications for this visit.    No Known Allergies   Past Medical History:  Diagnosis Date   Coronary artery disease    GERD (gastroesophageal reflux disease)    Hiatal hernia    Hyperlipidemia    Hypertension     Past Surgical History:  Procedure Laterality Date   COLPORRHAPHY  2023   Posterior   tummy tuck      Social History   Socioeconomic History   Marital status: Married    Spouse name: Not on file   Number of children: 4   Years of education: Not on file   Highest education level: Not on file  Occupational History   Not on file  Tobacco Use   Smoking status: Never   Smokeless  tobacco: Never  Vaping Use   Vaping status: Never Used  Substance and Sexual Activity   Alcohol use: No    Alcohol/week: 0.0 standard drinks of alcohol   Drug use: No   Sexual activity: Yes    Birth control/protection: Post-menopausal  Other Topics Concern   Not on file  Social History Narrative   Not on file   Social Drivers of Health   Financial Resource Strain: Not on file  Food Insecurity: Low Risk  (06/06/2023)   Received from [facility]   Hunger Vital Sign    Worried About Running Out of Food in the Last Year: Never true    Ran Out of Food in the Last Year: Never true  Transportation Needs: No Transportation Needs (06/06/2023)   Received from [facility]    In the past 12 months, has lack of reliable transportation  kept you from medical appointments, meetings, work or from getting things needed for daily living? : No  Physical Activity: Not on file  Stress: Not on file  Social Connections: Unknown (07/21/2021)   Received from [facility], [facility]   Social Network    Social Network: Not on file  Intimate Partner Violence: Low Risk  (04/15/2022)   Received from [facility] visits prior to 05/11/2022., [facility] visits prior to 05/11/2022.   Safety    How often does anyone, including family and friends, physically hurt you?: Never    How often does anyone, including family and friends, insult or talk down to you?: Never    How often does anyone, including family and friends, threaten you with harm?: Never    How often does anyone, including family and friends, scream or curse at you?: Never    Family History  Problem Relation Age of Onset   Cancer Father    Stroke Father    Bone cancer Father    Colon cancer Maternal Aunt     ROS: no fevers or chills, productive cough, hemoptysis, dysphasia, odynophagia, melena, hematochezia, dysuria, hematuria, rash, seizure activity, orthopnea, PND, pedal edema, claudication. Remaining systems are negative.  Physical Exam:   Blood pressure (!) 142/82, pulse 71, height 5\' 2"  (1.575 m), weight 174 lb (78.9 kg), last menstrual period 05/21/2015, SpO2 98%.  General:  Well developed/well nourished in NAD Skin warm/dry Patient not depressed No peripheral clubbing Back-normal HEENT-normal/normal eyelids Neck supple/normal carotid upstroke bilaterally; no bruits; no JVD; no thyromegaly chest - CTA/ normal expansion CV - RRR/normal S1 and S2; no murmurs, rubs or gallops;  PMI nondisplaced Abdomen -NT/ND, no HSM, no mass, + bowel sounds, no bruit 2+ femoral pulses, no bruits Ext-no edema, chords, 2+ DP Neuro-grossly nonfocal  EKG Interpretation Date/Time:  Monday June 30 2023 08:40:47 EDT Ventricular Rate:   71 PR Interval:  146 QRS Duration:  86 QT Interval:  400 QTC Calculation: 434 R Axis:   54  Text Interpretation: Normal sinus rhythm Normal ECG Confirmed by Alexandria Angel ([id number]) on 06/30/2023 8:42:21 AM    A/P  1 Chest pain-recent cath showed patent stent in Lcx and no other obstructive CAD. Plan continue medical therapy with toprol  and imdur.  2 CAD-continue ASA and statin; DC plavix .  3 hyperlipidemia-continue crestor , zetia  and repatha  (recently initiated); check lipids and liver in 8 weeks.  4 hypertension-BP mildly elevated; she will follow this and we will advance regimen if needed.  Alexandria Angel, MD

## 2023-06-22 ENCOUNTER — Other Ambulatory Visit

## 2023-06-23 ENCOUNTER — Ambulatory Visit

## 2023-06-23 DIAGNOSIS — G510 Bell's palsy: Secondary | ICD-10-CM | POA: Diagnosis not present

## 2023-06-23 DIAGNOSIS — R42 Dizziness and giddiness: Secondary | ICD-10-CM | POA: Diagnosis not present

## 2023-06-23 DIAGNOSIS — R519 Headache, unspecified: Secondary | ICD-10-CM | POA: Diagnosis not present

## 2023-06-23 DIAGNOSIS — H539 Unspecified visual disturbance: Secondary | ICD-10-CM

## 2023-06-23 LAB — CYTOLOGY - PAP
Comment: NEGATIVE
Diagnosis: NEGATIVE
High risk HPV: NEGATIVE

## 2023-06-24 ENCOUNTER — Encounter: Payer: Self-pay | Admitting: Obstetrics and Gynecology

## 2023-06-30 ENCOUNTER — Other Ambulatory Visit: Payer: Self-pay | Admitting: Physician Assistant

## 2023-06-30 ENCOUNTER — Encounter: Payer: Self-pay | Admitting: Physician Assistant

## 2023-06-30 ENCOUNTER — Ambulatory Visit (INDEPENDENT_AMBULATORY_CARE_PROVIDER_SITE_OTHER): Payer: BLUE CROSS/BLUE SHIELD | Admitting: Cardiology

## 2023-06-30 ENCOUNTER — Encounter: Payer: Self-pay | Admitting: Cardiology

## 2023-06-30 VITALS — BP 142/82 | HR 71 | Ht 62.0 in | Wt 174.0 lb

## 2023-06-30 DIAGNOSIS — E785 Hyperlipidemia, unspecified: Secondary | ICD-10-CM | POA: Diagnosis not present

## 2023-06-30 DIAGNOSIS — R9089 Other abnormal findings on diagnostic imaging of central nervous system: Secondary | ICD-10-CM

## 2023-06-30 DIAGNOSIS — Z8679 Personal history of other diseases of the circulatory system: Secondary | ICD-10-CM

## 2023-06-30 DIAGNOSIS — I10 Essential (primary) hypertension: Secondary | ICD-10-CM

## 2023-06-30 NOTE — Progress Notes (Signed)
 Jamese,   No acute findings.  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   Moderate signalling abnormality for age. Could be due to migraines, headaches, inflammation, accumulation of plaque in small vessels but cannot rule out MS. Would like for you to see neurology for consult.

## 2023-06-30 NOTE — Patient Instructions (Addendum)
 Medication Instructions:   STOP PLAVIX   *If you need a refill on your cardiac medications before your next appointment, please call your pharmacy*  Lab Work:  Your physician recommends that you return for lab work in: [facility]  If you have labs (blood work) drawn today and your tests are completely normal, you will receive your results only by: MyChart Message (if you have MyChart) OR A paper copy in the mail If you have any lab test that is abnormal or we need to change your treatment, we will call you to review the results.   Follow-Up: At [facility], you and your health needs are our priority.  As part of our continuing mission to provide you with exceptional heart care, our providers are all part of one team.  This team includes your primary Cardiologist (physician) and Advanced Practice Providers or APPs (Physician Assistants and Nurse Practitioners) who all work together to provide you with the care you need, when you need it.  Your next appointment:   6 month(s)  Provider:   Alexandria Angel, MD

## 2023-07-03 DIAGNOSIS — N644 Mastodynia: Secondary | ICD-10-CM | POA: Diagnosis not present

## 2023-07-03 DIAGNOSIS — R92323 Mammographic fibroglandular density, bilateral breasts: Secondary | ICD-10-CM | POA: Diagnosis not present

## 2023-07-04 ENCOUNTER — Encounter: Payer: Self-pay | Admitting: Physician Assistant

## 2023-07-04 ENCOUNTER — Telehealth (INDEPENDENT_AMBULATORY_CARE_PROVIDER_SITE_OTHER): Admitting: Physician Assistant

## 2023-07-04 ENCOUNTER — Ambulatory Visit

## 2023-07-04 VITALS — Ht 63.0 in | Wt 174.0 lb

## 2023-07-04 DIAGNOSIS — M25511 Pain in right shoulder: Secondary | ICD-10-CM

## 2023-07-04 DIAGNOSIS — G8929 Other chronic pain: Secondary | ICD-10-CM

## 2023-07-04 DIAGNOSIS — M25512 Pain in left shoulder: Secondary | ICD-10-CM | POA: Diagnosis not present

## 2023-07-04 DIAGNOSIS — R9089 Other abnormal findings on diagnostic imaging of central nervous system: Secondary | ICD-10-CM | POA: Diagnosis not present

## 2023-07-04 DIAGNOSIS — R9082 White matter disease, unspecified: Secondary | ICD-10-CM

## 2023-07-04 DIAGNOSIS — N950 Postmenopausal bleeding: Secondary | ICD-10-CM

## 2023-07-04 NOTE — Progress Notes (Signed)
..  Virtual Visit via Video Note  I connected with Olivia Miller on 07/07/23 at  2:20 PM EDT by a video enabled telemedicine application and verified that I am speaking with the correct person using two identifiers.  Location: Patient: home Provider: clinic  .Olivia AasParticipating in visit:  Patient: Olivia Miller Provider: Sandy Crumb PA-C   I discussed the limitations of evaluation and management by telemedicine and the availability of in person appointments. The patient expressed understanding and agreed to proceed.  History of Present Illness:  Pt would like to go over recent MRI results of brain.   MPRESSION: 1. No evidence of an acute intracranial abnormality. 2. Multifocal T2 FLAIR hyperintense signal abnormality within the cerebral white matter and pons, overall moderate for age. Findings are nonspecific and differential considerations include chronic small vessel ischemic disease, sequelae of chronic migraine headaches, sequelae of a prior infectious/inflammatory process and sequelae of demyelinating disease, among others. 3. 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  She continues to have bilateral shoulder pain with left being worse and more constant than the right. She denies any injury. Ongoing for at least 3 months. Hurts to lay on left arm. Not using or doing anything to treat pain.     Observations/Objective: No acute distress Normal mood  Normal breathing   Assessment and Plan: Olivia AasAaron AasSherita was seen today for medical management of chronic issues.  Diagnoses and all orders for this visit:  White matter abnormality on MRI of brain  Chronic pain of both shoulders -     DG Shoulder Left; Future -     DG Foot Complete Right; Future  Abnormal finding on MRI of brain   Discussed MRI results and how no acute findings but would like neurology to be consulted to make sure no work up needed to be done to  rule out MS. Pt does have hx of headaches and the white matter changes could be migraine related. Pt has appt with neuro.   Discussed chronic since going on more than 3 months bilateral shoulder pain. Sounds like bursitis. Encouraged patient to schedule with Dr. Elva Hamburger for evaluation. Xrays ordered for patient to have done before appt. Encouraged use of voltaren  gel, ice and shoulder exercises.     Follow Up Instructions:    I discussed the assessment and treatment plan with the patient. The patient was provided an opportunity to ask questions and all were answered. The patient agreed with the plan and demonstrated an understanding of the instructions.   The patient was advised to call back or seek an in-person evaluation if the symptoms worsen or if the condition fails to improve as anticipated.   Exodus Kutzer, PA-C

## 2023-07-07 ENCOUNTER — Encounter: Payer: Self-pay | Admitting: Physician Assistant

## 2023-07-07 DIAGNOSIS — G8929 Other chronic pain: Secondary | ICD-10-CM | POA: Insufficient documentation

## 2023-07-07 DIAGNOSIS — R9082 White matter disease, unspecified: Secondary | ICD-10-CM | POA: Insufficient documentation

## 2023-07-07 DIAGNOSIS — M25511 Pain in right shoulder: Secondary | ICD-10-CM | POA: Insufficient documentation

## 2023-07-11 ENCOUNTER — Ambulatory Visit (INDEPENDENT_AMBULATORY_CARE_PROVIDER_SITE_OTHER)

## 2023-07-11 DIAGNOSIS — M25512 Pain in left shoulder: Secondary | ICD-10-CM

## 2023-07-11 DIAGNOSIS — M79671 Pain in right foot: Secondary | ICD-10-CM | POA: Diagnosis not present

## 2023-07-11 DIAGNOSIS — M19012 Primary osteoarthritis, left shoulder: Secondary | ICD-10-CM | POA: Diagnosis not present

## 2023-07-11 DIAGNOSIS — I700 Atherosclerosis of aorta: Secondary | ICD-10-CM | POA: Diagnosis not present

## 2023-07-11 DIAGNOSIS — G8929 Other chronic pain: Secondary | ICD-10-CM | POA: Diagnosis not present

## 2023-07-14 ENCOUNTER — Encounter: Payer: Self-pay | Admitting: Physician Assistant

## 2023-07-14 DIAGNOSIS — M7731 Calcaneal spur, right foot: Secondary | ICD-10-CM | POA: Insufficient documentation

## 2023-07-14 DIAGNOSIS — M19012 Primary osteoarthritis, left shoulder: Secondary | ICD-10-CM | POA: Insufficient documentation

## 2023-07-14 NOTE — Progress Notes (Signed)
 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 Dr. Elva Hamburger can help manage this. You do have a heel spur as well that he can also help manage.

## 2023-07-17 ENCOUNTER — Encounter: Payer: Self-pay | Admitting: Obstetrics and Gynecology

## 2023-07-17 ENCOUNTER — Telehealth: Payer: Self-pay

## 2023-07-17 NOTE — Telephone Encounter (Signed)
   Pre-operative Risk Assessment    Patient Name: Olivia Miller  DOB: 01-Aug-1960 MRN: [id number]   Date of last office visit: 06/30/2023 with Dr. Audery Blazing  Date of next office visit: None  Request for Surgical Clearance    Procedure:  Dental Extraction - Amount of Teeth to be Pulled:  1  Date of Surgery:  Clearance  left message for date                             Surgeon:  Dr. Allana Ishikawa Group or Practice Name:  [facility] Phone number:  [phone] Fax number:  [phone]   Type of Clearance Requested:   - Medical  - Pharmacy:  Hold Aspirin  and Clopidogrel  (Plavix ) Not indicated and Plavix  has been discontinued from patients current medication list.    Type of Anesthesia:  Left message to confirm   Additional requests/questions:    Kenny Peals   07/17/2023, 3:09 PM

## 2023-07-17 NOTE — Telephone Encounter (Signed)
 Waiting to hear from the dental office to confirm date of procedure and if anesthesia being used.

## 2023-07-18 DIAGNOSIS — R42 Dizziness and giddiness: Secondary | ICD-10-CM | POA: Diagnosis not present

## 2023-07-18 NOTE — Telephone Encounter (Signed)
 Dental office sent updated request with needed information.    PROCEDURE: SURGICAL EXTRACTION 1 TOOTH (PARTIALLY IMPACTED THIRD MOLAR)  ANESTHESIA: CONSCIOUS SEDATION/IV  MED HOLD: PLAVIX 

## 2023-07-21 DIAGNOSIS — Z419 Encounter for procedure for purposes other than remedying health state, unspecified: Secondary | ICD-10-CM | POA: Diagnosis not present

## 2023-07-21 NOTE — Telephone Encounter (Signed)
   Name: Olivia Miller  DOB: [birth year]  MRN: [id number]   Primary Cardiologist: None  Chart reviewed as part of pre-operative protocol coverage. Olivia Miller was last seen on 06/30/2023 by Dr. Audery Blazing.  She had recently been evaluated by [facility] in March 2025 at [facility] which showed nonobstructive CAD and patent stent to LCx. Plavix  was stopped at office visit and patient was instructed to continue aspirin . I spoke with patient today, 07/21/2023, and she is doing well from a cardiac standpoint without chest pain or shortness of breath. Her BP has been well controlled.   Therefore, based on ACC/AHA guidelines, the patient would be an acceptable risk for the planned procedure without further cardiovascular testing.   Ideally aspirin  should be continued without interruption, however if the bleeding risk is too great, aspirin  may be held for 5-7 days prior to surgery. Please resume aspirin  post operatively when it is felt to be safe from a bleeding standpoint.    I will route this recommendation to the requesting party via Epic fax function and remove from pre-op pool. Please call with questions.  Morey Ar, NP 07/21/2023, 9:33 AM

## 2023-07-22 ENCOUNTER — Telehealth: Payer: Self-pay

## 2023-07-22 NOTE — Telephone Encounter (Signed)
 Spoke with radiology and they will burn CD for provider. She can pickup within 10-15 minutes  Patient informed and will  pickup CD either today or tomorrow morning.

## 2023-07-22 NOTE — Telephone Encounter (Signed)
 Copied from CRM [id number]. Topic: Clinical - Lab/Test Results >> Jul 22, 2023 10:29 AM Olivia Miller wrote: Reason for CRM: patient went to see her neurogolist on friday  and specialist Dr. Rebecca Campus with [facility]  was not able to see the MRI due to with [facility] requesting  to get a copy of the CD of the MRI since her neurogolist is not [place]  need soon as possible to rule out MS  patient call back [phone]  , patient can pick up the CD

## 2023-07-23 NOTE — Telephone Encounter (Signed)
 Surgeons office resent over the clearance request. I will fax all notes to the surgeons office.

## 2023-08-19 DIAGNOSIS — R42 Dizziness and giddiness: Secondary | ICD-10-CM | POA: Diagnosis not present

## 2023-08-20 NOTE — Progress Notes (Signed)
      GYNECOLOGY OFFICE PROCEDURE NOTE   Olivia Miller is a 63 y.o. [id number] here for endometrial biopsy for PMB.   US  from 4/25 Uterus Measurements: 6.6 x 3.7 x 5.5 cm = volume: 71 mL. Small intramural fibroids within the anterior uterine body measuring up to 1.7 x 1.4 x 1.1 cm. There is a larger submucosal fibroid measuring 1.9 x 1.8 x 2 cm in the lower uterine segment. Endometrium Thickness: 5 mm  Pap/HPV wnl 06/18/23   ENDOMETRIAL BIOPSY     The indications for endometrial biopsy were reviewed.   Risks of the biopsy including cramping, bleeding, infection, uterine perforation, inadequate specimen and need for additional procedures were discussed. Offered alternative of hysteroscopy, dilation and curettage in OR. The patient states she understands the R/B/I/A and agrees to undergo procedure today. Urine pregnancy test was Not indicated. Consent was signed. Time out was performed.    Patient was positioned in dorsal lithotomy position. A vaginal speculum was placed.  The cervix was visualized and was prepped with Betadine.  A single-toothed tenaculum was placed on the anterior lip of the cervix to stabilize it. The 3 mm pipelle was easily introduced into the endometrial cavity without difficulty to a depth of 7 cm, and a Scant amount of tissue was obtained after two passes and sent to pathology. The instruments were removed from the patient's vagina. Minimal bleeding from the cervix was noted. The patient tolerated the procedure well.   Patient was given post procedure instructions.  Will follow up pathology and manage accordingly; patient will be contacted with results and recommendations.  Routine preventative health maintenance measures emphasized.       Lacey Pian, MD, FACOG Obstetrician & Gynecologist, [facility] for [facility], [facility]

## 2023-08-21 ENCOUNTER — Other Ambulatory Visit (HOSPITAL_COMMUNITY)
Admission: RE | Admit: 2023-08-21 | Discharge: 2023-08-21 | Disposition: A | Discharge status: Home / Self Care | Source: Ambulatory Visit | Attending: Obstetrics and Gynecology | Admitting: Obstetrics and Gynecology

## 2023-08-21 ENCOUNTER — Ambulatory Visit: Admitting: Obstetrics and Gynecology

## 2023-08-21 VITALS — BP 123/83 | HR 71 | Ht 63.0 in | Wt 173.0 lb

## 2023-08-21 DIAGNOSIS — Z419 Encounter for procedure for purposes other than remedying health state, unspecified: Secondary | ICD-10-CM | POA: Diagnosis not present

## 2023-08-21 DIAGNOSIS — N950 Postmenopausal bleeding: Secondary | ICD-10-CM | POA: Diagnosis not present

## 2023-08-21 DIAGNOSIS — N858 Other specified noninflammatory disorders of uterus: Secondary | ICD-10-CM | POA: Diagnosis not present

## 2023-08-22 LAB — SURGICAL PATHOLOGY

## 2023-08-24 ENCOUNTER — Ambulatory Visit: Payer: Self-pay | Admitting: Obstetrics and Gynecology

## 2023-08-29 ENCOUNTER — Institutional Professional Consult (permissible substitution) (INDEPENDENT_AMBULATORY_CARE_PROVIDER_SITE_OTHER): Admitting: Otolaryngology

## 2023-09-20 DIAGNOSIS — Z419 Encounter for procedure for purposes other than remedying health state, unspecified: Secondary | ICD-10-CM | POA: Diagnosis not present

## 2023-09-30 ENCOUNTER — Encounter: Payer: Self-pay | Admitting: *Deleted

## 2023-10-21 DIAGNOSIS — Z419 Encounter for procedure for purposes other than remedying health state, unspecified: Secondary | ICD-10-CM | POA: Diagnosis not present

## 2023-10-29 ENCOUNTER — Ambulatory Visit (INDEPENDENT_AMBULATORY_CARE_PROVIDER_SITE_OTHER): Admitting: Physician Assistant

## 2023-10-29 ENCOUNTER — Encounter: Payer: Self-pay | Admitting: Physician Assistant

## 2023-10-29 VITALS — BP 124/84 | HR 79 | Ht 62.0 in | Wt 179.0 lb

## 2023-10-29 DIAGNOSIS — U071 COVID-19: Secondary | ICD-10-CM

## 2023-10-29 DIAGNOSIS — R6889 Other general symptoms and signs: Secondary | ICD-10-CM | POA: Diagnosis not present

## 2023-10-29 LAB — POC SOFIA 2 FLU + SARS ANTIGEN FIA
Influenza A, POC: NEGATIVE
Influenza B, POC: NEGATIVE
SARS Coronavirus 2 Ag: POSITIVE — AB

## 2023-10-29 LAB — POCT RAPID STREP A (OFFICE): Rapid Strep A Screen: NEGATIVE

## 2023-10-29 MED ORDER — DEXAMETHASONE 4 MG PO TABS: 4.0000 mg | ORAL_TABLET | Freq: Two times a day (BID) | ORAL | 0 refills | Status: AC

## 2023-10-29 MED ORDER — HYDROCOD POLI-CHLORPHE POLI ER 10-8 MG/5ML PO SUER: 5.0000 mL | Freq: Two times a day (BID) | ORAL | 0 refills | Status: AC | PRN

## 2023-10-29 MED ORDER — NIRMATRELVIR/RITONAVIR (PAXLOVID) TABLET (RENAL DOSING)
2.0000 | ORAL_TABLET | Freq: Two times a day (BID) | ORAL | 0 refills | Status: AC
Start: 2023-10-29 — End: 2023-11-03

## 2023-10-29 NOTE — Patient Instructions (Addendum)
 Start paxlovid  for 5 days Decadron  only if needed Tussionex for cough Ok to take mucinex to help with congestion and flonase to help with nasal congestion Tylenol or for aches and pains  COVID-19: What to Know COVID-19 is an infection caused by a virus called SARS-CoV-2. This type of virus is called a coronavirus. People with COVID-19 may: Have few to no symptoms. Have mild to moderate symptoms that affect their lungs and breathing. Get very sick. What are the causes?  COVID-19 is caused by a virus. This virus may be in the air as droplets or on surfaces. It can spread from an infected person when they cough, sneeze, speak, sing, or breathe. You may become infected if: You breathe in the infected droplets in the air. You touch an object that has the virus on it. What increases the risk? You are at risk of getting COVID-19 if you have been around someone with the infection. You may be more likely to get very sick if: You are 48 years old or older. You have certain medical conditions, such as: Heart disease. Diabetes. Long-term respiratory disease. Cancer. Pregnancy. You are immunocompromised. This means your body can't fight infections easily. You have a disability that makes it hard for you to move around, you have trouble moving, or you can't move at all. What are the signs or symptoms? People may have different symptoms from COVID-19. The symptoms can also be mild to very bad. They often show up in 5-6 days after being infected. But, they can take up to 14 days to appear. Common symptoms are: Cough. Feeling tired. New loss of taste or smell. Fever. Less common symptoms are: Sore throat. Headache. Body or muscle aches. Diarrhea. A skin rash or fingers or toes that are a different color than usual. Red or irritated eyes. Sometimes, COVID-19 does not cause symptoms. How is this diagnosed? COVID-19 can be diagnosed with tests done in the lab or at home. Fluid from your nose,  mouth, or lungs will be used to check for the virus. How is this treated? Treatment for COVID-19 depends on how sick you are. Mild symptoms can be treated at home with rest, fluids, and over-the-counter medicines. very bad symptoms may be treated in a hospital intensive care unit (ICU). If you have symptoms and are at risk of getting very sick, you may be given a medicine that fights viruses. This medicine is called an antiviral. How is this prevented? To protect yourself from COVID-19: Know your risk factors. Get vaccinated. If your body can't fight infections easily, talk to your provider about treatment to help prevent COVID-19. Stay at least about 3 feet (1 meter) away from other people. Wear mask that fits well when: You can't stay at a distance from people. You're in a place with not a lot of air flow. Try to be in open spaces with good air flow when you are in public. Wash your hands often or use an alcohol-based hand sanitizer. Cover your nose and mouth when you cough or sneeze. If you think you have COVID-19 or have been around someone who has it, stay home and away from other people as told by your provider or health officials. Where to find more information To learn more: Go to [employer]Prom[employer].no Click Health Topics. Type COVID-19 in the search box. Go to VisitDestination.com.br Click Health Topics. Then click All Topics. Type COVID-19 in the search box. Get help right away if: You have trouble breathing or get short of breath.  You have pain or pressure in your chest. You're feeling confused. These symptoms may be an emergency. Get help right away. Call 911. Do not wait to see if the symptoms will go away. Do not drive yourself to the hospital. This information is not intended to replace advice given to you by your health care provider. Make sure you discuss any questions you have with your health care provider. Document Revised: 11/28/2022 Document Reviewed: 11/20/2022 Elsevier Patient  Education  2025 [employer].

## 2023-10-29 NOTE — Progress Notes (Signed)
 Acute Office Visit  Subjective:     Patient ID: Olivia Miller, female    DOB: [birth year], 63 y.o.   MRN: [id number]  Chief Complaint  Patient presents with   Cough    Sinus congestion, cough  and body aches     HPI Patient is in today for 1 day of cough, sinus pressure, ear pain, headache, fatigue, chills, body aches, stomach aches and congestion. She has not had any trouble breathing,. She has not had her covid booster or flu shot this year. She has been around a lot of people because her husband died on 11/21/2023. She denies any known sick contacts. She is taking OtC medications for symptom relief.   ROS See HPI.      Objective:    BP 124/84   Pulse 79   Ht 5' 2 (1.575 m)   Wt 179 lb (81.2 kg)   LMP 05/21/2015 (Within Days)   SpO2 96%   BMI 32.74 kg/m  BP Readings from Last 3 Encounters:  10/29/23 124/84  08/21/23 123/83  06/30/23 (!) 142/82   Wt Readings from Last 3 Encounters:  10/29/23 179 lb (81.2 kg)  08/21/23 173 lb (78.5 kg)  07/04/23 174 lb (78.9 kg)      Physical Exam Constitutional:      Appearance: She is ill-appearing.  HENT:     Head: Normocephalic.     Nose: Congestion and rhinorrhea present.  Eyes:     Conjunctiva/sclera: Conjunctivae normal.  Cardiovascular:     Rate and Rhythm: Normal rate and regular rhythm.  Pulmonary:     Effort: Pulmonary effort is normal.     Breath sounds: Normal breath sounds.  Musculoskeletal:     Cervical back: Neck supple. Tenderness present.     Right lower leg: No edema.     Left lower leg: No edema.  Lymphadenopathy:     Cervical: Cervical adenopathy present.  Neurological:     General: No focal deficit present.     Mental Status: She is alert and oriented to person, place, and time.  Psychiatric:        Mood and Affect: Mood normal.     Results for orders placed or performed in visit on 10/29/23  POC SOFIA 2 FLU + SARS ANTIGEN FIA  Result Value Ref Range   Influenza A, POC Negative Negative    Influenza B, POC Negative Negative   SARS Coronavirus 2 Ag Positive (A) Negative  POCT rapid strep A  Result Value Ref Range   Rapid Strep A Screen Negative Negative        Assessment & Plan:  SABRASABRAGisella was seen today for cough.  Diagnoses and all orders for this visit:  COVID-19 virus infection -     POC SOFIA 2 FLU + SARS ANTIGEN FIA -     POCT rapid strep A -     nirmatrelvir /ritonavir , renal dosing, (PAXLOVID ) 10 x 150 MG & 10 x 100MG  TABS; Take 2 tablets by mouth 2 (two) times daily for 5 days. (Take nirmatrelvir  150 mg one tablet twice daily for 5 days and ritonavir  100 mg one tablet twice daily for 5 days) Patient GFR is 58. -     chlorpheniramine-HYDROcodone (TUSSIONEX) 10-8 MG/5ML; Take 5 mLs by mouth every 12 (twelve) hours as needed. -     dexamethasone  (DECADRON ) 4 MG tablet; Take 1 tablet (4 mg total) by mouth 2 (two) times daily with a meal.  Flu-like symptoms -  POC SOFIA 2 FLU + SARS ANTIGEN FIA -     POCT rapid strep A -     nirmatrelvir /ritonavir , renal dosing, (PAXLOVID ) 10 x 150 MG & 10 x 100MG  TABS; Take 2 tablets by mouth 2 (two) times daily for 5 days. (Take nirmatrelvir  150 mg one tablet twice daily for 5 days and ritonavir  100 mg one tablet twice daily for 5 days) Patient GFR is 58. -     chlorpheniramine-HYDROcodone (TUSSIONEX) 10-8 MG/5ML; Take 5 mLs by mouth every 12 (twelve) hours as needed. -     dexamethasone  (DECADRON ) 4 MG tablet; Take 1 tablet (4 mg total) by mouth 2 (two) times daily with a meal.   Covid positive Day 2 of symptoms Started paxlovid  for 5 days at the renal dose due to GFR at 58 Discussed symptomatic care Tussionex given for cough, sedation warning given when using Quarantine for 5 days HO given Decadron  given if symptoms not improving after 5 days, encouraged not to use before then without getting approval from me  Follow up as needed if symptoms persist or worsen  Vermell Bologna, PA-C

## 2023-11-14 DIAGNOSIS — R159 Full incontinence of feces: Secondary | ICD-10-CM | POA: Diagnosis not present

## 2023-11-21 DIAGNOSIS — Z419 Encounter for procedure for purposes other than remedying health state, unspecified: Secondary | ICD-10-CM | POA: Diagnosis not present

## 2024-02-27 ENCOUNTER — Encounter: Payer: Self-pay | Admitting: Physician Assistant

## 2024-02-27 ENCOUNTER — Other Ambulatory Visit: Payer: Self-pay | Admitting: Physician Assistant

## 2024-02-27 ENCOUNTER — Ambulatory Visit: Admitting: Physician Assistant

## 2024-02-27 VITALS — BP 133/86 | HR 78 | Ht 62.0 in | Wt 179.0 lb

## 2024-02-27 DIAGNOSIS — K219 Gastro-esophageal reflux disease without esophagitis: Secondary | ICD-10-CM | POA: Diagnosis not present

## 2024-02-27 DIAGNOSIS — I10 Essential (primary) hypertension: Secondary | ICD-10-CM | POA: Diagnosis not present

## 2024-02-27 DIAGNOSIS — R9082 White matter disease, unspecified: Secondary | ICD-10-CM

## 2024-02-27 DIAGNOSIS — Z955 Presence of coronary angioplasty implant and graft: Secondary | ICD-10-CM | POA: Diagnosis not present

## 2024-02-27 DIAGNOSIS — E785 Hyperlipidemia, unspecified: Secondary | ICD-10-CM | POA: Diagnosis not present

## 2024-02-27 DIAGNOSIS — E7841 Elevated Lipoprotein(a): Secondary | ICD-10-CM

## 2024-02-27 DIAGNOSIS — R7303 Prediabetes: Secondary | ICD-10-CM

## 2024-02-27 DIAGNOSIS — Z8679 Personal history of other diseases of the circulatory system: Secondary | ICD-10-CM | POA: Diagnosis not present

## 2024-02-27 DIAGNOSIS — E782 Mixed hyperlipidemia: Secondary | ICD-10-CM | POA: Diagnosis not present

## 2024-02-27 DIAGNOSIS — E538 Deficiency of other specified B group vitamins: Secondary | ICD-10-CM | POA: Diagnosis not present

## 2024-02-27 DIAGNOSIS — R159 Full incontinence of feces: Secondary | ICD-10-CM

## 2024-02-27 DIAGNOSIS — Z780 Asymptomatic menopausal state: Secondary | ICD-10-CM | POA: Insufficient documentation

## 2024-02-27 MED ORDER — ASPIRIN 81 MG PO CHEW: 81.0000 mg | CHEWABLE_TABLET | Freq: Every day | ORAL | Status: AC

## 2024-02-27 NOTE — Patient Instructions (Signed)
 1. Dizziness. Her imaging results do not align with the diagnostic criteria for multiple sclerosis (MS). The observed white matter changes are likely due to the natural process of old blood vessels breaking down and new ones forming. At this juncture, there is no basis to diagnose her with MS or any other demyelinating disease. A handout detailing the Epley maneuver will be provided. Should she experience any new symptoms or other concerns, she is advised to contact the office immediately.   This was from neurology.

## 2024-03-01 ENCOUNTER — Ambulatory Visit: Payer: Self-pay | Admitting: Physician Assistant

## 2024-03-01 LAB — CMP14+EGFR
ALT: 15 IU/L (ref 0–32)
AST: 28 IU/L (ref 0–40)
Albumin: 4.5 g/dL (ref 3.9–4.9)
Alkaline Phosphatase: 98 IU/L (ref 49–135)
BUN/Creatinine Ratio: 21 (ref 12–28)
BUN: 20 mg/dL (ref 8–27)
Bilirubin Total: 0.2 mg/dL (ref 0.0–1.2)
CO2: 18 mmol/L — AB (ref 20–29)
Calcium: 9.4 mg/dL (ref 8.7–10.3)
Chloride: 102 mmol/L (ref 96–106)
Creatinine, Ser: 0.97 mg/dL (ref 0.57–1.00)
Globulin, Total: 2.8 g/dL (ref 1.5–4.5)
Glucose: 98 mg/dL (ref 70–99)
Potassium: 4.4 mmol/L (ref 3.5–5.2)
Sodium: 145 mmol/L — AB (ref 134–144)
Total Protein: 7.3 g/dL (ref 6.0–8.5)
eGFR: 66 mL/min/1.73

## 2024-03-01 LAB — CBC WITH DIFFERENTIAL/PLATELET
Basophils Absolute: 0 x10E3/uL (ref 0.0–0.2)
Basos: 1 %
EOS (ABSOLUTE): 0.2 x10E3/uL (ref 0.0–0.4)
Eos: 4 %
Hematocrit: 42 % (ref 34.0–46.6)
Hemoglobin: 13.6 g/dL (ref 11.1–15.9)
Immature Grans (Abs): 0 x10E3/uL (ref 0.0–0.1)
Immature Granulocytes: 0 %
Lymphocytes Absolute: 1.7 x10E3/uL (ref 0.7–3.1)
Lymphs: 39 %
MCH: 30.7 pg (ref 26.6–33.0)
MCHC: 32.4 g/dL (ref 31.5–35.7)
MCV: 95 fL (ref 79–97)
Monocytes Absolute: 0.3 x10E3/uL (ref 0.1–0.9)
Monocytes: 8 %
Neutrophils Absolute: 2.1 x10E3/uL (ref 1.4–7.0)
Neutrophils: 48 %
Platelets: 270 x10E3/uL (ref 150–450)
RBC: 4.43 x10E6/uL (ref 3.77–5.28)
RDW: 13.5 % (ref 11.7–15.4)
WBC: 4.4 x10E3/uL (ref 3.4–10.8)

## 2024-03-01 LAB — HEMOGLOBIN A1C
Est. average glucose Bld gHb Est-mCnc: 126 mg/dL
Hgb A1c MFr Bld: 6 % — ABNORMAL HIGH (ref 4.8–5.6)

## 2024-03-01 LAB — LIPID PANEL
Chol/HDL Ratio: 4.9 ratio — AB (ref 0.0–4.4)
Cholesterol, Total: 321 mg/dL — AB (ref 100–199)
HDL: 65 mg/dL
LDL Chol Calc (NIH): 240 mg/dL — AB (ref 0–99)
Triglycerides: 97 mg/dL (ref 0–149)
VLDL Cholesterol Cal: 16 mg/dL (ref 5–40)

## 2024-03-01 LAB — VITAMIN B12: Vitamin B-12: 678 pg/mL (ref 232–1245)

## 2024-03-01 LAB — VITAMIN D 25 HYDROXY (VIT D DEFICIENCY, FRACTURES): Vit D, 25-Hydroxy: 29.5 ng/mL — AB (ref 30.0–100.0)

## 2024-03-01 NOTE — Progress Notes (Signed)
 "  Established Patient Office Visit  Subjective   Patient ID: Olivia Miller, female    DOB: June 10, 1960  Age: 63 y.o. MRN: [id number]  Chief Complaint  Patient presents with   Follow-up    Pt would like a referral for neurologist. Previous referral was placed with [facility] and patient is not satisfied. Pt is experiencing headaches recently alongside eye dryness     HPI .SABRADiscussed the use of AI scribe software for clinical note transcription with the patient, who gave verbal consent to proceed.  History of Present Illness Olivia Miller is a 63 year old female with coronary artery disease/history of MI/HTN who presents for medication management.  Coronary artery disease and medication nonadherence - History of coronary artery disease with prior stent placement - Nonadherent with prescribed medications including aspirin , Repatha  injections, Zetia , and rosuvastatin  for cholesterol management - Discontinued medications due to dizziness and sharp pains under the breast, which she associated with her medication regimen - Not taking aspirin , which was recommended to prevent blood  clotting accumulation around the stent  Gastrointestinal symptoms - Heartburn present, not currently on medication - Previously used Pepcid  as needed, especially before consuming trigger foods such as tomato-based products, wings, and pizza  Neurological symptoms - Experiences brief 'drive by headaches' lasting about one minute, occurring approximately twice per week - Occasional eye twitching - No numbness, weakness, or balance issues - Recent neurology consultation to rule out multiple sclerosis due to previous symptoms and white matter changes on imaging  Genitourinary evaluation - Recent cystoscopy performed by urology with normal results  Lifestyle and dietary modifications - Remains physically active, 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    Objective:     BP 133/86 (BP Location: Left Arm, Patient Position: Sitting, Cuff Size: Normal)   Pulse 78   Ht 5' 2 (1.575 m)   Wt 179 lb (81.2 kg)   LMP 05/21/2015   SpO2 98%   BMI 32.74 kg/m  BP Readings from Last 3 Encounters:  02/27/24 133/86  10/29/23 124/84  08/21/23 123/83   Wt Readings from Last 3 Encounters:  02/27/24 179 lb (81.2 kg)  10/29/23 179 lb (81.2 kg)  08/21/23 173 lb (78.5 kg)      Physical Exam Constitutional:      Appearance: Normal appearance. She is obese.  HENT:     Head: Normocephalic.  Cardiovascular:     Rate and Rhythm: Normal rate and regular rhythm.  Pulmonary:     Effort: Pulmonary effort is normal.     Breath sounds: Normal breath sounds.  Neurological:     General: No focal deficit present.     Mental Status: She is alert and oriented to person, place, and time.  Psychiatric:        Mood and Affect: Mood normal.          Assessment & Plan:  Olivia Miller was seen today for follow-up.  Diagnoses and all orders for this visit:  Pre-diabetes -     CBC with Differential/Platelet -     Hemoglobin A1c  Dyslipidemia -     CBC with Differential/Platelet -     Lipid panel  Stented coronary artery -  CBC with Differential/Platelet -     aspirin  81 MG chewable tablet; Chew 1 tablet (81 mg total) by mouth daily.  White matter abnormality on MRI of brain -     CBC with Differential/Platelet  History of CAD (coronary artery disease) -     CBC with Differential/Platelet -     Lipid panel  Elevated lipoprotein A level -     CBC with Differential/Platelet -     Lipid panel  Mixed hyperlipidemia -     CBC with Differential/Platelet -     Lipid  panel  Primary hypertension -     CBC with Differential/Platelet -     CMP14+EGFR  B12 deficiency -     CBC with Differential/Platelet -     Vitamin B12  Post-menopausal -     CBC with Differential/Platelet -     VITAMIN D  25 Hydroxy (Vit-D Deficiency, Fractures)  Gastroesophageal reflux disease, unspecified whether esophagitis present   Assessment & Plan Coronary artery disease with coronary stent Non-compliance with medications, not taking aspirin , crucial for preventing stent thrombosis. Emphasized aspirin 's role in preventing blood clots around the stent. Discussed aspirin  side effects and advised taking with food. - Started aspirin  81 mg daily with food to prevent stent thrombosis. - Ordered labs to assess current cholesterol levels and other relevant parameters. - Discussed potential reintroduction of cholesterol medications one at a time to monitor for side effects.  Mixed hyperlipidemia with elevated lipoprotein(a) Elevated lipoprotein(a) contributing to plaque accumulation. Previous medications discontinued due to side effects. Emphasized managing cholesterol to prevent cardiovascular events. Goal: LDL under 70 mg/dL. - Ordered labs to assess current cholesterol levels. - Discussed potential reintroduction of cholesterol medications one at a time to monitor for side effects.  Primary hypertension Blood pressure well-controlled at 133/85 mmHg. Losartan  previously prescribed for blood pressure management and cardiac remodeling. Emphasized maintaining blood pressure under 130/80 mmHg. Discussed holding off on losartan  given current control. - Will consider reintroducing losartan  if blood pressure increases.  Gastroesophageal reflux disease Persistent heartburn not managed with medication. Discussed use of Pepcid  as needed for heartburn, especially with trigger foods. - Recommended Pepcid  as needed for heartburn, particularly with trigger foods.  White matter disease of  brain Previous MRI showed white matter changes not consistent with MS. Symptoms include transient headaches and eye movements. Neurology evaluation did not find evidence of MS. Discussed natural aging or migraines as possible causes. - Will consider second opinion if desired, ensuring MRI disc is available for review. - Patient ok to hold off on second opinion at this time.   General health maintenance Discussed lifestyle modifications including dietary changes and natural remedies. Emphasized regular exercise and monitoring blood pressure. - Encouraged regular exercise and dietary modifications.  Spent 50 minutes with patient in chart review and coordination of care.   Return in about 6 months (around 08/27/2024).    Ashanta Amoroso, PA-C  "

## 2024-03-01 NOTE — Progress Notes (Signed)
 [place],   Kidney function stable at GFR at 66.  Liver enzymes normalized.  B12 looks great.  Vitamin D  not to goal and down a lot from 1 year ago. What dose are you taking now? Increase by 1000 units daily.  A1C improving. Still in pre-diabetes range.   CO2 low, having any trouble breathing?  Sodium was also elevated just a little.  Recheck in 2 weeks just to make sure this is a persistent issue.  Cholesterol not to goal. LDL is very elevated and very concerning for cardiovascular risk. Are you willing to start a statin drug again and see how improves numbers and you tolerate?

## 2024-03-02 ENCOUNTER — Other Ambulatory Visit: Payer: Self-pay | Admitting: Physician Assistant

## 2024-03-02 MED ORDER — ROSUVASTATIN CALCIUM 5 MG PO TABS: 5.0000 mg | ORAL_TABLET | Freq: Every day | ORAL | 0 refills | Status: AC

## 2024-03-12 ENCOUNTER — Encounter: Payer: Self-pay | Admitting: Physician Assistant

## 2024-03-12 ENCOUNTER — Ambulatory Visit: Admitting: Physician Assistant

## 2024-03-12 VITALS — BP 112/71 | HR 96 | Ht 62.0 in | Wt 174.0 lb

## 2024-03-12 DIAGNOSIS — H539 Unspecified visual disturbance: Secondary | ICD-10-CM | POA: Diagnosis not present

## 2024-03-12 DIAGNOSIS — R1013 Epigastric pain: Secondary | ICD-10-CM

## 2024-03-12 DIAGNOSIS — R9082 White matter disease, unspecified: Secondary | ICD-10-CM | POA: Diagnosis not present

## 2024-03-12 DIAGNOSIS — E875 Hyperkalemia: Secondary | ICD-10-CM

## 2024-03-12 DIAGNOSIS — R42 Dizziness and giddiness: Secondary | ICD-10-CM | POA: Diagnosis not present

## 2024-03-12 DIAGNOSIS — G245 Blepharospasm: Secondary | ICD-10-CM | POA: Diagnosis not present

## 2024-03-12 DIAGNOSIS — R519 Headache, unspecified: Secondary | ICD-10-CM | POA: Diagnosis not present

## 2024-03-12 DIAGNOSIS — R0602 Shortness of breath: Secondary | ICD-10-CM | POA: Diagnosis not present

## 2024-03-12 NOTE — Patient Instructions (Addendum)
 Stop ASA and see if upper GI symptoms improve.  Continue on crestor .  Recheck labs today.

## 2024-03-13 LAB — BMP8+EGFR
BUN/Creatinine Ratio: 17 (ref 12–28)
BUN: 15 mg/dL (ref 8–27)
CO2: 23 mmol/L (ref 20–29)
Calcium: 9.4 mg/dL (ref 8.7–10.3)
Chloride: 99 mmol/L (ref 96–106)
Creatinine, Ser: 0.88 mg/dL (ref 0.57–1.00)
Glucose: 78 mg/dL (ref 70–99)
Potassium: 4.2 mmol/L (ref 3.5–5.2)
Sodium: 139 mmol/L (ref 134–144)
eGFR: 74 mL/min/1.73

## 2024-03-13 LAB — CBC WITH DIFFERENTIAL/PLATELET
Basophils Absolute: 0 x10E3/uL (ref 0.0–0.2)
Basos: 1 %
EOS (ABSOLUTE): 0.2 x10E3/uL (ref 0.0–0.4)
Eos: 4 %
Hematocrit: 43.3 % (ref 34.0–46.6)
Hemoglobin: 14.4 g/dL (ref 11.1–15.9)
Immature Grans (Abs): 0 x10E3/uL (ref 0.0–0.1)
Immature Granulocytes: 0 %
Lymphocytes Absolute: 2.1 x10E3/uL (ref 0.7–3.1)
Lymphs: 40 %
MCH: 31.1 pg (ref 26.6–33.0)
MCHC: 33.3 g/dL (ref 31.5–35.7)
MCV: 94 fL (ref 79–97)
Monocytes Absolute: 0.5 x10E3/uL (ref 0.1–0.9)
Monocytes: 9 %
Neutrophils Absolute: 2.4 x10E3/uL (ref 1.4–7.0)
Neutrophils: 46 %
Platelets: 267 x10E3/uL (ref 150–450)
RBC: 4.63 x10E6/uL (ref 3.77–5.28)
RDW: 13.1 % (ref 11.7–15.4)
WBC: 5.2 x10E3/uL (ref 3.4–10.8)

## 2024-03-13 LAB — LIPASE: Lipase: 38 U/L (ref 14–72)

## 2024-03-13 LAB — CK: Total CK: 344 U/L — ABNORMAL HIGH (ref 32–182)

## 2024-03-15 ENCOUNTER — Ambulatory Visit: Payer: Self-pay | Admitting: Physician Assistant

## 2024-03-15 DIAGNOSIS — R748 Abnormal levels of other serum enzymes: Secondary | ICD-10-CM

## 2024-03-15 DIAGNOSIS — R1013 Epigastric pain: Secondary | ICD-10-CM | POA: Insufficient documentation

## 2024-03-15 DIAGNOSIS — E875 Hyperkalemia: Secondary | ICD-10-CM | POA: Insufficient documentation

## 2024-03-15 DIAGNOSIS — G245 Blepharospasm: Secondary | ICD-10-CM | POA: Insufficient documentation

## 2024-03-15 DIAGNOSIS — R0602 Shortness of breath: Secondary | ICD-10-CM | POA: Insufficient documentation

## 2024-03-15 NOTE — Progress Notes (Signed)
 "  Established Patient Office Visit  Subjective   Patient ID: Olivia Miller, female    DOB: [birth year]  Age: 64 y.o. MRN: [id number]  Chief Complaint  Patient presents with   Shortness of Breath    HPI Discussed the use of AI scribe software for clinical note transcription with the patient, who gave verbal consent to proceed.  History of Present Illness Olivia Miller is a 64 year old female with coronary artery disease who presents with gastrointestinal discomfort and palpitations.  Gastrointestinal symptoms - Gastrointestinal discomfort characterized by gas and indigestion, onset immediately after starting aspirin  and crestor . She feels like it is the asprin causing discomfort though.  - Sharp pain under the breast, particularly in the morning, associated with pressure and burping. - Symptoms improve with eating oatmeal instead of lighter foods such as oranges or strawberries. - Manages symptoms by eating smaller meals. - Not currently taking acid reducers. - Uses apple cider vinegar and fennel seeds, which provide some relief.  Cardiac symptoms - Palpitations described as heart 'trying to go too fast.' - Symptoms alleviated by drinking water or sitting down. - No swelling noted. - Concerned about sodium intake and recalls previously elevated sodium levels. - Attributes shortness of breath to being overweight.  Lipid management and medication side effects - On 5 mg crestor  and Colestipol. - Constipation attributed to medication; previously had regular bowel movements. - Concerned about cholesterol level, currently 240 mg/dL, higher than previous year.  Neurological symptoms - Twitching in the eye, associated with stress or dehydration. - Headaches that come and go quickly, primarily on the right side. - Occasional dizziness, related to vertigo. - History of Bell's Palsy. - Uses artificial tears daily. - No vision changes. - Dizziness occurs when removing  glasses.  Hydration and dietary habits - Drinks a lot of water and has tried [employer], which she believes is beneficial. - Drinks only water and has not added any other substances to her diet.    ROS See HPI.    Objective:     BP 112/71   Pulse 96   Ht 5' 2 (1.575 m)   Wt 174 lb (78.9 kg)   LMP 05/21/2015   SpO2 96%   BMI 31.83 kg/m  BP Readings from Last 3 Encounters:  03/12/24 112/71  02/27/24 133/86  10/29/23 124/84   Wt Readings from Last 3 Encounters:  03/12/24 174 lb (78.9 kg)  02/27/24 179 lb (81.2 kg)  10/29/23 179 lb (81.2 kg)      Physical Exam Constitutional:      Appearance: She is well-developed.  HENT:     Head: Normocephalic.  Cardiovascular:     Rate and Rhythm: Normal rate and regular rhythm.  Pulmonary:     Effort: Pulmonary effort is normal.     Breath sounds: Normal breath sounds.  Neurological:     General: No focal deficit present.     Mental Status: She is alert.  Psychiatric:        Mood and Affect: Mood normal.       Assessment & Plan:  .Olivia Miller was seen today for shortness of breath.  Diagnoses and all orders for this visit:  SOB (shortness of breath) on exertion -     BMP8+eGFR -     BMP8+eGFR  Hyperkalemia -     BMP8+eGFR -     BMP8+eGFR  Epigastric discomfort -     CK -     Lipase -     CBC  w/Diff/Platelet -     BMP8+eGFR  Blepharospasm of both eyes -     BMP8+eGFR  White matter abnormality on MRI of brain -     Ambulatory referral to Neurology  Frequent headaches -     Ambulatory referral to Neurology  Vision changes -     Ambulatory referral to Neurology  Dizziness -     Ambulatory referral to Neurology   Assessment & Plan Hiatal hernia with gastroesophageal reflux disease Symptoms include gas, pressure, and indigestion, potentially exacerbated by aspirin . Relief with oatmeal and smaller meals. No current use of acid reducers. Fennel seeds provide relief, but contraindications are unknown. -  Stopped aspirin  to assess impact on symptoms. - Will consider starting an acid reducer if symptoms persist. - Suggested to stop fennel seeds.  - lipase/cbc/cmp/ck ordered today.   Coronary artery disease with coronary stent Concerns about aspirin  causing gastrointestinal symptoms. Heart rate is well-managed. No swelling or significant shortness of breath. Discussed potential alternatives to aspirin , including taking it with a larger meal or every other day. Consideration of antiplatelet medications like Plavix  if needed. - Stopped aspirin  to assess impact on gastrointestinal symptoms. - Will consider taking aspirin  with a larger meal or every other day if symptoms improve. - CK level ordered to make sure patient is not having any statin related side effects.  - Will discuss potential alternatives with cardiology if needed and medications cannot be re-started.   Mixed hyperlipidemia with elevated lipoprotein(a) Cholesterol level was 240 mg/dL. Statin use may contribute to constipation. She feels better on medication despite side effects. - Continue statin therapy. - Consider taking statin every other day if constipation persists.  Primary hypertension Blood pressure is well-controlled.  Prediabetes A1c levels have decreased slightly but remain in the prediabetic range. - Continue monitoring A1c levels.  Constipation due to medication Constipation likely due to statin use. She reports regular bowel movements previously. - Consider taking miralax daily with 4oz of juice.   Blephaspasm -reassurance given - warm compresses over eyes - bmp to look for any electrolyte abnormality - stay hydrated - avoid eye stress and long time focused or eyes on screen  Migraine with vertigo Intermittent sharp pain under the breast and right-sided headaches. No significant neurological findings. She does have intermittent blurry vision. Previous MRI showed no tumors or brain masses but did show  significant white matter disease. Symptoms may be related to a migraine variant. Consideration of neurology referral due to dizziness and headaches. - Referred to neurology for further evaluation of headaches and dizziness.    Olivia Engelmann, PA-C  "

## 2024-03-15 NOTE — Progress Notes (Signed)
 [place],   Your CK level is high which we can see as side effect to statin with abnormal muscle break down. Stop crestor  and recheck CK level in 2 weeks.   All other labs look great.

## 2024-03-16 ENCOUNTER — Encounter: Payer: Self-pay | Admitting: Physician Assistant

## 2024-03-27 LAB — CK: Total CK: 445 U/L — ABNORMAL HIGH (ref 32–182)

## 2024-03-28 NOTE — Progress Notes (Signed)
 CK is continuing to rise. Confirm you have not had any crestor  in the last 2 weeks?

## 2024-07-02 ENCOUNTER — Ambulatory Visit: Admitting: Diagnostic Neuroimaging

## 2024-08-27 ENCOUNTER — Ambulatory Visit: Admitting: Physician Assistant
